# Patient Record
Sex: Female | Born: 1990 | Race: Black or African American | Hispanic: No | Marital: Single | Smoking: Current some day smoker
Health system: Southern US, Community
[De-identification: ages and names within clinical notes are randomized; demographics above are authoritative.]

## PROBLEM LIST (undated history)

## (undated) ENCOUNTER — Inpatient Hospital Stay (HOSPITAL_COMMUNITY): Payer: Self-pay

## (undated) DIAGNOSIS — I10 Essential (primary) hypertension: Secondary | ICD-10-CM

## (undated) DIAGNOSIS — H00019 Hordeolum externum unspecified eye, unspecified eyelid: Secondary | ICD-10-CM

## (undated) DIAGNOSIS — B999 Unspecified infectious disease: Secondary | ICD-10-CM

## (undated) DIAGNOSIS — E669 Obesity, unspecified: Secondary | ICD-10-CM

## (undated) HISTORY — PX: NO PAST SURGERIES: SHX2092

---
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
## 2011-01-11 ENCOUNTER — Emergency Department (HOSPITAL_COMMUNITY)
Admission: EM | Admit: 2011-01-11 | Discharge: 2011-01-11 | Disposition: A | Discharge status: Home / Self Care | Payer: Commercial Managed Care - PPO | Attending: Emergency Medicine | Admitting: Emergency Medicine

## 2011-01-11 DIAGNOSIS — N898 Other specified noninflammatory disorders of vagina: Secondary | ICD-10-CM | POA: Insufficient documentation

## 2011-01-11 DIAGNOSIS — N390 Urinary tract infection, site not specified: Secondary | ICD-10-CM | POA: Insufficient documentation

## 2011-01-11 DIAGNOSIS — E669 Obesity, unspecified: Secondary | ICD-10-CM | POA: Insufficient documentation

## 2011-01-11 DIAGNOSIS — R10819 Abdominal tenderness, unspecified site: Secondary | ICD-10-CM | POA: Insufficient documentation

## 2011-01-11 DIAGNOSIS — R109 Unspecified abdominal pain: Secondary | ICD-10-CM | POA: Insufficient documentation

## 2011-01-11 LAB — URINALYSIS, ROUTINE W REFLEX MICROSCOPIC
Glucose, UA: NEGATIVE mg/dL
Protein, ur: 100 mg/dL — AB
Specific Gravity, Urine: 1.021 (ref 1.005–1.030)
pH: 7 (ref 5.0–8.0)

## 2011-01-11 LAB — CBC
MCHC: 32.8 g/dL (ref 30.0–36.0)
Platelets: 291 10*3/uL (ref 150–400)
RDW: 15 % (ref 11.5–15.5)
WBC: 7 10*3/uL (ref 4.0–10.5)

## 2011-01-11 LAB — URINE MICROSCOPIC-ADD ON

## 2012-09-20 ENCOUNTER — Emergency Department (HOSPITAL_COMMUNITY)
Admission: EM | Admit: 2012-09-20 | Discharge: 2012-09-20 | Disposition: A | Discharge status: Home / Self Care | Payer: Self-pay | Attending: Emergency Medicine | Admitting: Emergency Medicine

## 2012-09-20 ENCOUNTER — Encounter (HOSPITAL_COMMUNITY): Payer: Self-pay | Admitting: Emergency Medicine

## 2012-09-20 DIAGNOSIS — R112 Nausea with vomiting, unspecified: Secondary | ICD-10-CM | POA: Insufficient documentation

## 2012-09-20 DIAGNOSIS — R197 Diarrhea, unspecified: Secondary | ICD-10-CM | POA: Insufficient documentation

## 2012-09-20 DIAGNOSIS — F17200 Nicotine dependence, unspecified, uncomplicated: Secondary | ICD-10-CM | POA: Insufficient documentation

## 2012-09-20 DIAGNOSIS — Z3202 Encounter for pregnancy test, result negative: Secondary | ICD-10-CM | POA: Insufficient documentation

## 2012-09-20 LAB — POCT PREGNANCY, URINE: Preg Test, Ur: NEGATIVE

## 2012-09-20 LAB — URINALYSIS, ROUTINE W REFLEX MICROSCOPIC
Nitrite: NEGATIVE
Protein, ur: NEGATIVE mg/dL
Specific Gravity, Urine: 1.023 (ref 1.005–1.030)
Urobilinogen, UA: 1 mg/dL (ref 0.0–1.0)

## 2012-09-20 LAB — POCT I-STAT, CHEM 8
BUN: 10 mg/dL (ref 6–23)
Calcium, Ion: 1.27 mmol/L — ABNORMAL HIGH (ref 1.12–1.23)
Creatinine, Ser: 0.9 mg/dL (ref 0.50–1.10)
Hemoglobin: 13.9 g/dL (ref 12.0–15.0)
Sodium: 141 mEq/L (ref 135–145)
TCO2: 25 mmol/L (ref 0–100)

## 2012-09-20 MED ORDER — METOCLOPRAMIDE HCL 10 MG PO TABS: 10.0000 mg | ORAL_TABLET | Freq: Four times a day (QID) | ORAL | Status: DC | PRN

## 2012-09-20 MED ORDER — ONDANSETRON 8 MG PO TBDP: ORAL_TABLET | ORAL | Status: DC

## 2012-09-20 MED ORDER — LOPERAMIDE HCL 2 MG PO CAPS: ORAL_CAPSULE | ORAL | Status: DC

## 2012-09-20 MED ORDER — ONDANSETRON 4 MG PO TBDP
8.0000 mg | ORAL_TABLET | Freq: Once | ORAL | Status: AC
  Administered 2012-09-20: 8 mg via ORAL
  Filled 2012-09-20: qty 2

## 2012-09-20 NOTE — ED Notes (Signed)
Pt c/o N/V/D starting today 

## 2012-09-20 NOTE — ED Provider Notes (Signed)
History   This chart was scribed for Hurman Horn, MD by Leone Payor, ED Scribe. This patient was seen in room TR09C/TR09C and the patient's care was started at 1645.   CSN: [id number]  Arrival date & time 09/20/12  1312   First MD Initiated Contact with Patient 09/20/12 1645      Chief Complaint  Patient presents with  . Emesis  . Diarrhea     The history is provided by the patient. No language interpreter was used.    Kayla Bautista is a 21 y.o. female who presents to the Emergency Department complaining of new, ongoing, several episodes of non-bloody vomiting starting today. She has associated nausea, diarrhea as well. Pt states she does not know if she has had ill contacts but could have caught something from work. Pt states she has eaten a few crackers and is able to keep water down in the ED. She denies fever, hallucinations, constipation, cough, chest pain, SOB, vaginal bleeding, vaginal discharge, rash, body aches. She does not have constant or localized abdominal pain.  She denies having DM, sickle cell, asthma history.   Pt is a current everyday smoker but denies alcohol use. History reviewed. No pertinent past medical history.  History reviewed. No pertinent past surgical history.  History reviewed. No pertinent family history.  History  Substance Use Topics  . Smoking status: Current Every Day Smoker  . Smokeless tobacco: Not on file  . Alcohol Use: No    No OB history provided.   Review of Systems  10 Systems reviewed and are negative for acute change except as noted in the HPI.   Allergies  Review of patient's allergies indicates no known allergies.  Home Medications   Current Outpatient Rx  Name  Route  Sig  Dispense  Refill  . LOPERAMIDE HCL 2 MG PO CAPS      Take two tabs po initially, then one tab after each loose stool: max 8 tabs in 24 hours   12 capsule   0   . METOCLOPRAMIDE HCL 10 MG PO TABS   Oral   Take 1 tablet (10 mg total) by  mouth every 6 (six) hours as needed (nausea/headache).   6 tablet   0   . ONDANSETRON 8 MG PO TBDP      8mg  ODT q4 hours prn nausea   4 tablet   0     BP 160/80  Pulse 76  Temp 98.1 F (36.7 C) (Oral)  Resp 18  SpO2 100%  Physical Exam  Nursing note and vitals reviewed. Constitutional:       Awake, alert, nontoxic appearance.  HENT:  Head: Atraumatic.       Mucous membranes are moist.   Eyes: Right eye exhibits no discharge. Left eye exhibits no discharge.  Neck: Neck supple.  Cardiovascular: Normal rate, regular rhythm and normal heart sounds.   No murmur heard. Pulmonary/Chest: Effort normal and breath sounds normal. She has no wheezes. She exhibits no tenderness.  Abdominal: Soft. Bowel sounds are normal. She exhibits no distension and no mass. There is no tenderness. There is no rebound and no guarding.       No CVAT  Musculoskeletal: She exhibits no tenderness.       Baseline ROM, no obvious new focal weakness.  Neurological:       Mental status and motor strength appears baseline for patient and situation.  Skin: No rash noted.  Psychiatric: She has a normal mood and affect.  ED Course  Procedures (including critical care time)  DIAGNOSTIC STUDIES: Oxygen Saturation is 100% on room air, normal by my interpretation.    COORDINATION OF CARE:  Patient / Family / Caregiver informed of clinical course, understand medical decision-making process, and agree with plan.    Labs Reviewed  URINALYSIS, ROUTINE W REFLEX MICROSCOPIC - Abnormal; Notable for the following:    APPearance HAZY (*)     All other components within normal limits  POCT I-STAT, CHEM 8 - Abnormal; Notable for the following:    Calcium, Ion 1.27 (*)     All other components within normal limits  POCT PREGNANCY, URINE  LAB REPORT - SCANNED   No results found.   1. Vomiting and diarrhea       MDM   I personally performed the services described in this documentation, which was  scribed in my presence. The recorded information has been reviewed and is accurate.  I doubt any other EMC precluding discharge at this time including, but not necessarily limited to the following:SBI.  Hurman Horn, MD 09/23/12 [phone]

## 2012-09-20 NOTE — ED Notes (Signed)
Pt states she thought she saw something when she had a bm this afternoon, "i think it might have been a tapeworm"

## 2012-11-15 ENCOUNTER — Encounter (HOSPITAL_COMMUNITY): Payer: Self-pay

## 2012-11-15 ENCOUNTER — Emergency Department (INDEPENDENT_AMBULATORY_CARE_PROVIDER_SITE_OTHER)
Admission: EM | Admit: 2012-11-15 | Discharge: 2012-11-15 | Disposition: A | Discharge status: Home / Self Care | Payer: Self-pay | Source: Home / Self Care | Attending: Family Medicine | Admitting: Family Medicine

## 2012-11-15 DIAGNOSIS — B354 Tinea corporis: Secondary | ICD-10-CM

## 2012-11-15 MED ORDER — KETOCONAZOLE 2 % EX CREA: TOPICAL_CREAM | CUTANEOUS | Status: DC

## 2012-11-15 NOTE — ED Notes (Signed)
Patient complains of having a rash on her torso for almost 2 weeks

## 2012-11-15 NOTE — ED Provider Notes (Signed)
History   CSN: [id number]  Arrival date & time 11/15/12  1150   First MD Initiated Contact with Patient 11/15/12 1245     Chief Complaint  Patient presents with  . Rash    HPI Pt reports that she has been having a skin irriation for the past week.  It is located on back, stomach area.  It has been blistered.   Pt says that it does not itch.  Rash has not been painful.  Pt says that it is getting darker.  Pt says that it has been spreading.  Pt says that she is up to date with her vaccinations.  She says that she has no other medical history and not taking any medications.   Pt says that she has not been near anyone that has had a similar rash.  Her boyfriend does not have the rash.    History reviewed. No pertinent past medical history.  History reviewed. No pertinent past surgical history.  family history Hypertension  History  Substance Use Topics  . Smoking status: Current Every Day Smoker  . Smokeless tobacco: Not on file  . Alcohol Use: No    OB History   Grav Para Term Preterm Abortions TAB SAB Ect Mult Living                 Review of Systems  Skin: Positive for rash.  All other systems reviewed and are negative.    Allergies  Review of patient's allergies indicates no known allergies.  Home Medications   Current Outpatient Rx  Name  Route  Sig  Dispense  Refill  . loperamide (IMODIUM) 2 MG capsule      Take two tabs po initially, then one tab after each loose stool: max 8 tabs in 24 hours   12 capsule   0   . metoCLOPramide (REGLAN) 10 MG tablet   Oral   Take 1 tablet (10 mg total) by mouth every 6 (six) hours as needed (nausea/headache).   6 tablet   0   . ondansetron (ZOFRAN ODT) 8 MG disintegrating tablet      8mg  ODT q4 hours prn nausea   4 tablet   0    Pulse 83  Temp(Src) 98.7 F (37.1 C) (Oral)  SpO2 100%  LMP 11/07/2012  Physical Exam  Nursing note and vitals reviewed. Constitutional: She is oriented to person, place, and time.  She appears well-developed and well-nourished. No distress.  HENT:  Head: Normocephalic and atraumatic.  Eyes: Conjunctivae and EOM are normal. Pupils are equal, round, and reactive to light.  Neck: Normal range of motion. Neck supple.  Cardiovascular: Normal rate and regular rhythm.   Pulmonary/Chest: Effort normal.  Abdominal: Soft.  Musculoskeletal: Normal range of motion.  Neurological: She is alert and oriented to person, place, and time.  Skin: Skin is warm, dry and intact. Rash noted.     Psychiatric: She has a normal mood and affect. Her behavior is normal. Judgment and thought content normal.    ED Course  Procedures (including critical care time)  Labs Reviewed - No data to display No results found.  No diagnosis found.  MDM  IMPRESSION  Tinea corporis  RECOMMENDATIONS / PLAN Ketoconazole 2% creme apply to rash lesions twice per day  Printed patient education materials for patient to review  FOLLOW UP 3 weeks  The patient was given clear instructions to go to ER or return to medical center if symptoms don't improve, worsen or new  problems develop.  The patient verbalized understanding.  The patient was told to call to get lab results if they haven't heard anything in the next week.            Cleora Fleet, MD 11/15/12 1304

## 2012-12-19 ENCOUNTER — Emergency Department (HOSPITAL_COMMUNITY)
Admission: EM | Admit: 2012-12-19 | Discharge: 2012-12-19 | Disposition: A | Discharge status: Home / Self Care | Payer: Self-pay | Attending: Emergency Medicine | Admitting: Emergency Medicine

## 2012-12-19 ENCOUNTER — Encounter (HOSPITAL_COMMUNITY): Payer: Self-pay | Admitting: *Deleted

## 2012-12-19 DIAGNOSIS — R110 Nausea: Secondary | ICD-10-CM | POA: Insufficient documentation

## 2012-12-19 DIAGNOSIS — IMO0001 Reserved for inherently not codable concepts without codable children: Secondary | ICD-10-CM

## 2012-12-19 DIAGNOSIS — R51 Headache: Secondary | ICD-10-CM

## 2012-12-19 DIAGNOSIS — H53149 Visual discomfort, unspecified: Secondary | ICD-10-CM | POA: Insufficient documentation

## 2012-12-19 DIAGNOSIS — S0501XA Injury of conjunctiva and corneal abrasion without foreign body, right eye, initial encounter: Secondary | ICD-10-CM

## 2012-12-19 DIAGNOSIS — S0500XA Injury of conjunctiva and corneal abrasion without foreign body, unspecified eye, initial encounter: Secondary | ICD-10-CM | POA: Insufficient documentation

## 2012-12-19 DIAGNOSIS — F17200 Nicotine dependence, unspecified, uncomplicated: Secondary | ICD-10-CM | POA: Insufficient documentation

## 2012-12-19 DIAGNOSIS — X58XXXA Exposure to other specified factors, initial encounter: Secondary | ICD-10-CM | POA: Insufficient documentation

## 2012-12-19 DIAGNOSIS — H5710 Ocular pain, unspecified eye: Secondary | ICD-10-CM | POA: Insufficient documentation

## 2012-12-19 DIAGNOSIS — Y929 Unspecified place or not applicable: Secondary | ICD-10-CM | POA: Insufficient documentation

## 2012-12-19 DIAGNOSIS — Y939 Activity, unspecified: Secondary | ICD-10-CM | POA: Insufficient documentation

## 2012-12-19 DIAGNOSIS — Z8669 Personal history of other diseases of the nervous system and sense organs: Secondary | ICD-10-CM | POA: Insufficient documentation

## 2012-12-19 DIAGNOSIS — R030 Elevated blood-pressure reading, without diagnosis of hypertension: Secondary | ICD-10-CM | POA: Insufficient documentation

## 2012-12-19 HISTORY — DX: Hordeolum externum unspecified eye, unspecified eyelid: H00.019

## 2012-12-19 MED ORDER — HYDROCODONE-ACETAMINOPHEN 5-325 MG PO TABS: ORAL_TABLET | ORAL | Status: DC

## 2012-12-19 MED ORDER — HYDROCODONE-ACETAMINOPHEN 5-325 MG PO TABS
1.0000 | ORAL_TABLET | Freq: Once | ORAL | Status: AC
  Administered 2012-12-19: 1 via ORAL
  Filled 2012-12-19: qty 1

## 2012-12-19 MED ORDER — FLUORESCEIN SODIUM 1 MG OP STRP
ORAL_STRIP | OPHTHALMIC | Status: AC
  Administered 2012-12-19: 20:00:00
  Filled 2012-12-19: qty 1

## 2012-12-19 MED ORDER — TOBRAMYCIN 0.3 % OP SOLN
2.0000 [drp] | OPHTHALMIC | Status: DC
  Administered 2012-12-19: 2 [drp] via OPHTHALMIC
  Filled 2012-12-19: qty 5

## 2012-12-19 MED ORDER — TETRACAINE HCL 0.5 % OP SOLN
2.0000 [drp] | Freq: Once | OPHTHALMIC | Status: AC
  Administered 2012-12-19: 2 [drp] via OPHTHALMIC
  Filled 2012-12-19: qty 2

## 2012-12-19 NOTE — ED Provider Notes (Signed)
History     CSN: [id number]  Arrival date & time 12/19/12  1502   First MD Initiated Contact with Patient 12/19/12 1720      Chief Complaint  Patient presents with  . Headache    (Consider location/radiation/quality/duration/timing/severity/associated sxs/prior treatment) HPI Comments: Patient reports headache involving her right eye associated with eye pain and excessive tearing from the right eye over the last several days. She is nearsighted and uses glasses but not contacts. She denies any significant change in vision except for due to the tearing she does have some blurriness. She had some nausea today but no vomiting. She denies fever or chills. She thinks she may have had an eyelash in her right eye and she rubbed it out. Otherwise she denies any trauma. She also reports that she's had a long history of recurring styes but denies feeling a bump, mass or sensation of foreign body in her right eye. She reports that her right eyelid seems somewhat swollen to her. She denies a significant history of allergies. She denies a stiff neck, focal numbness or weakness of her arms or legs, slurred speech or facial droop.  Patient is a 22 y.o. female presenting with headaches. The history is provided by the patient and a friend.  Headache Associated symptoms: eye pain, nausea and photophobia   Associated symptoms: no fever, no neck pain, no neck stiffness, no numbness and no vomiting     Past Medical History  Diagnosis Date  . Stye     History reviewed. No pertinent past surgical history.  History reviewed. No pertinent family history.  History  Substance Use Topics  . Smoking status: Current Every Day Smoker  . Smokeless tobacco: Not on file  . Alcohol Use: No    OB History   Grav Para Term Preterm Abortions TAB SAB Ect Mult Living                  Review of Systems  Constitutional: Negative for fever and chills.  HENT: Negative for neck pain and neck stiffness.   Eyes:  Positive for photophobia, pain and redness.  Gastrointestinal: Positive for nausea. Negative for vomiting.  Neurological: Positive for headaches. Negative for weakness and numbness.  Psychiatric/Behavioral: Negative for confusion.  All other systems reviewed and are negative.    Allergies  Review of patient's allergies indicates no known allergies.  Home Medications   Current Outpatient Rx  Name  Route  Sig  Dispense  Refill  . ibuprofen (ADVIL,MOTRIN) 800 MG tablet   Oral   Take 800 mg by mouth every 8 (eight) hours as needed for pain.         Marland Kitchen HYDROcodone-acetaminophen (NORCO/VICODIN) 5-325 MG per tablet      1-2 tablets po q 6 hours prn moderate to severe pain   20 tablet   0     BP 172/80  Pulse 72  Temp(Src) 98.7 F (37.1 C) (Oral)  Resp 18  SpO2 100%  LMP 11/21/2012  Physical Exam  Nursing note and vitals reviewed. Constitutional: She is oriented to person, place, and time. She appears well-developed and well-nourished.  HENT:  Head: Normocephalic and atraumatic.  Eyes: EOM are normal. Pupils are equal, round, and reactive to light. No foreign bodies found. Right eye exhibits chemosis. Right eye exhibits no hordeolum. No foreign body present in the right eye. Right conjunctiva is injected. Right conjunctiva has no hemorrhage.  Fundoscopic exam:      The right eye shows no papilledema.  Slit lamp exam:      The right eye shows fluorescein uptake. The right eye shows no corneal abrasion and no corneal ulcer.    Neck: Normal range of motion. Neck supple.  Cardiovascular: Normal rate.   Pulmonary/Chest: Effort normal.  Abdominal: Soft.  Neurological: She is alert and oriented to person, place, and time. No cranial nerve deficit or sensory deficit. She exhibits normal muscle tone. Coordination normal. GCS eye subscore is 4. GCS verbal subscore is 5. GCS motor subscore is 6.  Normal coordination, gait  Skin: Skin is warm and dry. No rash noted.    ED Course    Procedures (including critical care time)  Labs Reviewed - No data to display No results found.   1. Conjunctival abrasion, right, initial encounter   2. Headache   3. Elevated blood pressure       MDM   Patient's eye pain and headache improved after tetracaine administration to right eye. Eye pressure measured with an almond her on right I measured 17 mm of mercury pressure. Visual acuity was unremarkable. Slight fluorescein uptake on the right lateral conjunctival margin. Plan is to provide antibiotic eyedrops, prescription for oral analgesics and referral to ophthalmologist for reexamination in a more urgent fashion either tomorrow or the next day. No focal neuro deficits otherwise. No rash or stiff neck or fevers noted. Patient does have a history of hypertension, however I feel that this is likely due to the pain.        Gavin Pound. Ryn Peine, MD 12/19/12 2019

## 2012-12-19 NOTE — Discharge Instructions (Signed)
 Eye Injury The eye can be injured by scratches, foreign bodies, contact lenses, very bright light (welding torches), and chemical irritation. The cornea (the clear part of the eye) is very sensitive; even minor injuries to it are painful. Most injuries to the cornea heal in 2-4 days. Treatment may include:  Antibiotic eye drops or ointment may be needed to soothe the eye and prevent infection. Drops that numb the eye (anesthetic drops) should not be used repeatedly as they can delay healing. Drops to dilate the pupil for 1-2 days are sometimes used to relieve pain.  Patching the eye can reduce irritation from blinking and bright light. When your eye is patched, you should not drive or operate machinery because your side vision and your ability to judge distances are decreased.  Rest your eye. Stay in a darkened room and wear sunglasses to reduce the irritation from light.  Do not rub your eye for the next 2 weeks to allow complete healing. If you have contact lenses, do not wear them until your caregiver says it is safe to do so. Pain medicine may also be needed for 1-2 days.  SEEK MEDICAL CARE IF:   You have increased pain, persistent irritation or blurred vision over the next 2 days.  Your symptoms are getting worse or not improving.  You have any other questions or concerns regarding your injury. Document Released: 10/23/2004 Document Revised: 12/08/2011 Document Reviewed: 09/15/2005 [facility] Patient Information 2013 [place], [place].    Narcotic and benzodiazepine use may cause drowsiness, slowed breathing or dependence.  Please use with caution and do not drive, operate machinery or watch young children alone while taking them.  Taking combinations of these medications or drinking alcohol will potentiate these effects.

## 2012-12-19 NOTE — ED Notes (Signed)
The pt has had a headache for 7 days no nv.  She has headaches but none as  Severe as this.  lmp last month

## 2012-12-19 NOTE — ED Notes (Signed)
Dr. Ghim at bedside. 

## 2012-12-19 NOTE — ED Notes (Signed)
Pt ambulated to restroom. 

## 2013-05-04 ENCOUNTER — Inpatient Hospital Stay (HOSPITAL_COMMUNITY)
Admission: AD | Admit: 2013-05-04 | Discharge: 2013-05-04 | Disposition: A | Discharge status: Home / Self Care | Payer: Medicaid Other | Source: Ambulatory Visit | Attending: Obstetrics & Gynecology | Admitting: Obstetrics & Gynecology

## 2013-05-04 ENCOUNTER — Encounter (HOSPITAL_COMMUNITY): Payer: Self-pay | Admitting: *Deleted

## 2013-05-04 DIAGNOSIS — Z3201 Encounter for pregnancy test, result positive: Secondary | ICD-10-CM | POA: Insufficient documentation

## 2013-05-04 HISTORY — DX: Unspecified infectious disease: B99.9

## 2013-05-04 LAB — POCT PREGNANCY, URINE: Preg Test, Ur: POSITIVE — AB

## 2013-05-04 NOTE — MAU Note (Signed)
Pt unable to pee, water given, returned to lobby

## 2013-05-04 NOTE — MAU Note (Signed)
Has been having little cramps  For past 2 wks, feels like period is going to start.  +HPT today, here for confirmation.

## 2013-05-04 NOTE — MAU Provider Note (Signed)
[name] is a 22 y.o. G1P0 at [redacted]w[redacted]d who presents to MAU today for confirmation of pregnancy. The patient denies abdominal pain, bleeding or any issues today.   BP 139/80  Pulse 87  Temp(Src) 98.3 F (36.8 C) (Oral)  Resp 20  Ht 5\' 6"  (1.676 m)  Wt 315 lb (142.883 kg)  BMI 50.87 kg/m2  LMP 03/26/2013 GENERAL: Well-developed, well-nourished female in no acute distress.  HEENT: Normocephalic, atraumatic.   LUNGS: Effort normal HEART: Regular rate  SKIN: Warm, dry and without erythema PSYCH: Normal mood and affect  Results for orders placed during the hospital encounter of 05/04/13 (from the past 24 hour(s))  POCT PREGNANCY, URINE     Status: Abnormal   Collection Time    05/04/13  2:18 PM      Result Value Range   Preg Test, Ur POSITIVE (*) NEGATIVE    A: Positive pregnancy test  P: Discharge home First trimester warning signs reviewed Patient given pregnancy confirmation letter and list of OB providers in the area Patient may return to MAU as needed  Freddi Starr, PA-C 05/04/2013 2:29 PM

## 2013-05-25 ENCOUNTER — Ambulatory Visit (HOSPITAL_COMMUNITY)
Admission: RE | Admit: 2013-05-25 | Discharge: 2013-05-25 | Disposition: A | Discharge status: Home / Self Care | Payer: Medicaid Other | Source: Ambulatory Visit | Attending: Advanced Practice Midwife | Admitting: Advanced Practice Midwife

## 2013-05-25 ENCOUNTER — Encounter: Payer: Self-pay | Admitting: Advanced Practice Midwife

## 2013-05-25 ENCOUNTER — Ambulatory Visit (INDEPENDENT_AMBULATORY_CARE_PROVIDER_SITE_OTHER): Payer: Medicaid Other | Admitting: Advanced Practice Midwife

## 2013-05-25 VITALS — BP 153/91 | Temp 97.5°F | Wt 309.6 lb

## 2013-05-25 DIAGNOSIS — O3680X0 Pregnancy with inconclusive fetal viability, not applicable or unspecified: Secondary | ICD-10-CM | POA: Insufficient documentation

## 2013-05-25 DIAGNOSIS — O99891 Other specified diseases and conditions complicating pregnancy: Secondary | ICD-10-CM

## 2013-05-25 DIAGNOSIS — Z3400 Encounter for supervision of normal first pregnancy, unspecified trimester: Secondary | ICD-10-CM | POA: Insufficient documentation

## 2013-05-25 DIAGNOSIS — Z3689 Encounter for other specified antenatal screening: Secondary | ICD-10-CM | POA: Insufficient documentation

## 2013-05-25 LAB — POCT URINALYSIS DIP (DEVICE)
Glucose, UA: NEGATIVE mg/dL
Hgb urine dipstick: NEGATIVE
Nitrite: NEGATIVE
Protein, ur: NEGATIVE mg/dL
Specific Gravity, Urine: >=1.03 (ref 1.005–1.030)
Urobilinogen, UA: 0.2 mg/dL (ref 0.0–1.0)

## 2013-05-25 NOTE — Progress Notes (Signed)
   Subjective:    Kayla Bautista is a G1P0 [redacted]w[redacted]d being seen today for her first obstetrical visit.  Her obstetrical history is significant for obesity. Patient does intend to breast feed. Pregnancy history fully reviewed.  Patient reports nausea.  Filed Vitals:   05/25/13 1001 05/25/13 1006  BP: 152/93 153/91  Temp: 97.5 F (36.4 C)   Weight: 309 lb 9.6 oz (140.434 kg)     HISTORY: OB History  Gravida Para Term Preterm AB SAB TAB Ectopic Multiple Living  1             # Outcome Date GA Lbr Len/2nd Weight Sex Delivery Anes PTL Lv  1 CUR              Past Medical History  Diagnosis Date  . Stye   . Infection     UTI   Past Surgical History  Procedure Laterality Date  . No past surgeries     Family History  Problem Relation Age of Onset  . Diabetes Mother   . Diabetes Maternal Grandmother   . Diabetes Maternal Grandfather      Exam    Uterus:   Unable to palpate due to body habitus  Pelvic Exam:    Perineum: No Hemorrhoids, Normal Perineum   Vulva: normal   Vagina:  normal mucosa, normal discharge   pH:    Cervix: no cervical motion tenderness, no lesions and nulliparous appearance   Adnexa: normal adnexa and no mass, fullness, tenderness   Bony Pelvis: average  System: Breast:  normal appearance, no masses or tenderness   Skin: normal coloration and turgor, no rashes    Neurologic: normal mood, gait normal; reflexes normal and symmetric   Extremities: normal strength, tone, and muscle mass, ROM of all joints is normal   HEENT neck supple with midline trachea and thyroid without masses   Mouth/Teeth mucous membranes moist, pharynx normal without lesions   Neck no masses   Cardiovascular: regular rate and rhythm, no murmurs or gallops   Respiratory:  appears well, vitals normal, no respiratory distress, acyanotic, normal RR, ear and throat exam is normal, neck free of mass or lymphadenopathy, chest clear, no wheezing, crepitations, rhonchi, normal symmetric  air entry   Abdomen: soft, non-tender; bowel sounds normal; no masses,  no organomegaly   Urinary: urethral meatus normal      Assessment:    Pregnancy: G1P0 1. Other current maternal conditions classifiable elsewhere, antepartum   2. Supervision of normal first pregnancy, unspecified trimester         Plan:     Initial labs drawn. Early glucola done today Prenatal vitamins. Problem list reviewed and updated. Genetic Screening discussed First Screen: requested.  Ultrasound discussed; fetal survey: requested.  Follow up in 4 weeks. 50% of 30 min visit spent on counseling and coordination of care.     LEFTWICH-KIRBY, Deklin Bieler 05/25/2013

## 2013-05-25 NOTE — Progress Notes (Signed)
Pulse- 78  Pain/pressure-back/side Weight gain 11-20lb New ob packet given Early glucola given due @ 1058 for BMI and mother has diabetes

## 2013-05-26 LAB — OBSTETRIC PANEL
Basophils Absolute: 0 10*3/uL (ref 0.0–0.1)
Basophils Relative: 0 % (ref 0–1)
Eosinophils Absolute: 0.2 10*3/uL (ref 0.0–0.7)
Hemoglobin: 11.8 g/dL — ABNORMAL LOW (ref 12.0–15.0)
Hepatitis B Surface Ag: NEGATIVE
MCH: 27.3 pg (ref 26.0–34.0)
MCHC: 33.4 g/dL (ref 30.0–36.0)
Neutro Abs: 4.4 10*3/uL (ref 1.7–7.7)
Neutrophils Relative %: 61 % (ref 43–77)
Platelets: 298 10*3/uL (ref 150–400)
RDW: 14.4 % (ref 11.5–15.5)
Rh Type: NEGATIVE

## 2013-05-26 LAB — HIV ANTIBODY (ROUTINE TESTING W REFLEX): HIV: NONREACTIVE

## 2013-05-27 ENCOUNTER — Other Ambulatory Visit: Payer: Self-pay | Admitting: Advanced Practice Midwife

## 2013-05-27 ENCOUNTER — Telehealth: Payer: Self-pay

## 2013-05-27 LAB — HEMOGLOBINOPATHY EVALUATION
Hemoglobin Other: 0 %
Hgb A: 97.5 % (ref 96.8–97.8)
Hgb F Quant: 0 % (ref 0.0–2.0)
Hgb S Quant: 0 %

## 2013-05-27 MED ORDER — NITROFURANTOIN MONOHYD MACRO 100 MG PO CAPS: 100.0000 mg | ORAL_CAPSULE | Freq: Two times a day (BID) | ORAL | Status: DC

## 2013-05-27 NOTE — Telephone Encounter (Signed)
Called pt and left message to return call to the clinics. Re:  Kayla Bautista wanted [place] to call pt to inform her of the need for 3hr lab result and pt has UTI and antibiotic therapy has been sent to her pharmacy.  3 hr appt scheduled for Thursday June 02, 2013 @ 0800

## 2013-05-27 NOTE — Progress Notes (Signed)
Urine culture positive for Ecoli at initial prenatal visit.  Macrobid BID x7 days sent to pt pharmacy. Pt called by clinic to let her know about abx and her need for 3 hour glucose test (1 hour result 136).

## 2013-05-28 LAB — CULTURE, OB URINE: Colony Count: >=100000

## 2013-05-31 ENCOUNTER — Other Ambulatory Visit: Payer: Self-pay

## 2013-05-31 ENCOUNTER — Telehealth: Payer: Self-pay | Admitting: *Deleted

## 2013-05-31 ENCOUNTER — Encounter: Payer: Self-pay | Admitting: Family Medicine

## 2013-05-31 DIAGNOSIS — O99810 Abnormal glucose complicating pregnancy: Secondary | ICD-10-CM

## 2013-05-31 NOTE — Telephone Encounter (Signed)
Kayla Bautista left a message stating she needs a call back that it is urgent. States she talked to the appointment desk and is scheduled for a 3 hr test Thursday she knew nothing about.  Called Kayla Bautista and she states she spoke again to front desk and they told her to come in today to do the 3 hr gtt which she did.  Asked what happens when it came back- explanations given that we would call her if positive for GDM and have come on Monday for education for diet and doing cbg's.  Patient voices understanding.

## 2013-06-01 ENCOUNTER — Encounter: Payer: Self-pay | Admitting: Obstetrics & Gynecology

## 2013-06-01 LAB — GLUCOSE TOLERANCE, 3 HOURS
Glucose Tolerance, 1 hour: 141 mg/dL (ref 70–189)
Glucose Tolerance, 2 hour: 151 mg/dL (ref 70–164)
Glucose Tolerance, Fasting: 85 mg/dL (ref 70–104)

## 2013-06-01 NOTE — Telephone Encounter (Signed)
Called patient, and informed her of UTI and need to pickup medication at her [facility] pharmacy and informed patient of normal 3 hr gtt. Patient verbalized understanding to all and had no further questions

## 2013-06-02 ENCOUNTER — Other Ambulatory Visit: Payer: Self-pay

## 2013-06-02 ENCOUNTER — Telehealth: Payer: Self-pay | Admitting: *Deleted

## 2013-06-02 NOTE — Telephone Encounter (Signed)
Pt left message stating that the medication prescribed is too expensive (>$40) she would like an alternate Rx. Please call back.

## 2013-06-04 ENCOUNTER — Telehealth: Payer: Self-pay | Admitting: Advanced Practice Midwife

## 2013-06-04 ENCOUNTER — Other Ambulatory Visit: Payer: Self-pay | Admitting: Advanced Practice Midwife

## 2013-06-04 MED ORDER — CEPHALEXIN 500 MG PO CAPS: 500.0000 mg | ORAL_CAPSULE | Freq: Four times a day (QID) | ORAL | Status: DC

## 2013-06-04 NOTE — Telephone Encounter (Signed)
Pt unable to afford Macrobid.  Keflex 500 mg  QID x7 days.

## 2013-06-06 NOTE — Telephone Encounter (Signed)
Called patient and informed her of new medication. Patient verbalized understanding and had no further questions

## 2013-06-16 ENCOUNTER — Other Ambulatory Visit: Payer: Self-pay | Admitting: Advanced Practice Midwife

## 2013-06-16 DIAGNOSIS — Z3682 Encounter for antenatal screening for nuchal translucency: Secondary | ICD-10-CM

## 2013-06-22 ENCOUNTER — Ambulatory Visit (INDEPENDENT_AMBULATORY_CARE_PROVIDER_SITE_OTHER): Payer: Medicaid Other | Admitting: Advanced Practice Midwife

## 2013-06-22 VITALS — BP 147/77 | Wt 314.5 lb

## 2013-06-22 DIAGNOSIS — Z3400 Encounter for supervision of normal first pregnancy, unspecified trimester: Secondary | ICD-10-CM

## 2013-06-22 LAB — POCT URINALYSIS DIP (DEVICE)
Bilirubin Urine: NEGATIVE
Glucose, UA: NEGATIVE mg/dL
Hgb urine dipstick: NEGATIVE
Leukocytes, UA: NEGATIVE
Nitrite: NEGATIVE
Urobilinogen, UA: 0.2 mg/dL (ref 0.0–1.0)
pH: 7 (ref 5.0–8.0)

## 2013-06-22 LAB — US OB LIMITED

## 2013-06-22 NOTE — Progress Notes (Signed)
Doing well.  Denies vaginal bleeding, LOF, cramping/contractions. Unable to doppler FHT, U/S in office today. Retake of BP improved.  Discussed lifestyle changes for improved BP including walking, decreased sodium, increased water intake. Will continue to monitor.

## 2013-06-22 NOTE — Progress Notes (Signed)
P-[id number] 

## 2013-06-22 NOTE — Progress Notes (Signed)
Informal [place] for FHR = 164 per PW doppler.  Lisa Leftwich-Kirby notified.

## 2013-06-24 ENCOUNTER — Other Ambulatory Visit: Payer: Self-pay

## 2013-06-24 ENCOUNTER — Ambulatory Visit (HOSPITAL_COMMUNITY)
Admission: RE | Admit: 2013-06-24 | Discharge: 2013-06-24 | Disposition: A | Discharge status: Home / Self Care | Payer: Medicaid Other | Source: Ambulatory Visit | Attending: Family Medicine | Admitting: Family Medicine

## 2013-06-24 DIAGNOSIS — E669 Obesity, unspecified: Secondary | ICD-10-CM | POA: Insufficient documentation

## 2013-06-24 DIAGNOSIS — Z3682 Encounter for antenatal screening for nuchal translucency: Secondary | ICD-10-CM

## 2013-06-24 DIAGNOSIS — O351XX0 Maternal care for (suspected) chromosomal abnormality in fetus, not applicable or unspecified: Secondary | ICD-10-CM | POA: Insufficient documentation

## 2013-06-24 DIAGNOSIS — O3510X0 Maternal care for (suspected) chromosomal abnormality in fetus, unspecified, not applicable or unspecified: Secondary | ICD-10-CM | POA: Insufficient documentation

## 2013-06-24 DIAGNOSIS — Z3689 Encounter for other specified antenatal screening: Secondary | ICD-10-CM | POA: Insufficient documentation

## 2013-07-05 ENCOUNTER — Encounter: Payer: Self-pay | Admitting: *Deleted

## 2013-07-06 ENCOUNTER — Ambulatory Visit (INDEPENDENT_AMBULATORY_CARE_PROVIDER_SITE_OTHER): Payer: Medicaid Other | Admitting: Obstetrics and Gynecology

## 2013-07-06 ENCOUNTER — Encounter: Payer: Self-pay | Admitting: *Deleted

## 2013-07-06 VITALS — BP 137/92 | Temp 99.2°F | Wt 315.5 lb

## 2013-07-06 DIAGNOSIS — O99212 Obesity complicating pregnancy, second trimester: Secondary | ICD-10-CM | POA: Insufficient documentation

## 2013-07-06 DIAGNOSIS — O162 Unspecified maternal hypertension, second trimester: Secondary | ICD-10-CM

## 2013-07-06 DIAGNOSIS — Z3402 Encounter for supervision of normal first pregnancy, second trimester: Secondary | ICD-10-CM

## 2013-07-06 DIAGNOSIS — Z23 Encounter for immunization: Secondary | ICD-10-CM

## 2013-07-06 DIAGNOSIS — E669 Obesity, unspecified: Secondary | ICD-10-CM

## 2013-07-06 DIAGNOSIS — O169 Unspecified maternal hypertension, unspecified trimester: Secondary | ICD-10-CM

## 2013-07-06 LAB — COMPREHENSIVE METABOLIC PANEL
ALT: 15 U/L (ref 0–35)
AST: 13 U/L (ref 0–37)
Alkaline Phosphatase: 42 U/L (ref 39–117)
BUN: 7 mg/dL (ref 6–23)
Creat: 0.59 mg/dL (ref 0.50–1.10)
Potassium: 4.1 mEq/L (ref 3.5–5.3)

## 2013-07-06 LAB — POCT URINALYSIS DIP (DEVICE)
Glucose, UA: NEGATIVE mg/dL
Hgb urine dipstick: NEGATIVE
Ketones, ur: NEGATIVE mg/dL
Specific Gravity, Urine: 1.025 (ref 1.005–1.030)

## 2013-07-06 NOTE — Progress Notes (Signed)
Morbid obesity and probable CHTN (elevations noted at OP visits prepregnancy)> baseline labs. Refer to [facility]. Scheduled anatomic [place] at 18 wks.Had NT. Offer MSAFP next visit.  Fundus 1/3 to u.

## 2013-07-06 NOTE — Patient Instructions (Addendum)
Pregnancy - Second Trimester The second trimester of pregnancy (3 to 6 months) is a period of rapid growth for you and your baby. At the end of the sixth month, your baby is about 9 inches long and weighs 1 1/2 pounds. You will begin to feel the baby move between 18 and 20 weeks of the pregnancy. This is called quickening. Weight gain is faster. A clear fluid (colostrum) may leak out of your breasts. You may feel small contractions of the womb (uterus). This is known as false labor or Braxton-Hicks contractions. This is like a practice for labor when the baby is ready to be born. Usually, the problems with morning sickness have usually passed by the end of your first trimester. Some women develop small dark blotches (called cholasma, mask of pregnancy) on their face that usually goes away after the baby is born. Exposure to the sun makes the blotches worse. Acne may also develop in some pregnant women and pregnant women who have acne, may find that it goes away. PRENATAL EXAMS  Blood work may continue to be done during prenatal exams. These tests are done to check on your health and the probable health of your baby. Blood work is used to follow your blood levels (hemoglobin). Anemia (low hemoglobin) is common during pregnancy. Iron and vitamins are given to help prevent this. You will also be checked for diabetes between 24 and 28 weeks of the pregnancy. Some of the previous blood tests may be repeated.  The size of the uterus is measured during each visit. This is to make sure that the baby is continuing to grow properly according to the dates of the pregnancy.  Your blood pressure is checked every prenatal visit. This is to make sure you are not getting toxemia.  Your urine is checked to make sure you do not have an infection, diabetes or protein in the urine.  Your weight is checked often to make sure gains are happening at the suggested rate. This is to ensure that both you and your baby are growing  normally.  Sometimes, an ultrasound is performed to confirm the proper growth and development of the baby. This is a test which bounces harmless sound waves off the baby so your caregiver can more accurately determine due dates. Sometimes, a test is done on the amniotic fluid surrounding the baby. This test is called an amniocentesis. The amniotic fluid is obtained by sticking a needle into the belly (abdomen). This is done to check the chromosomes in instances where there is a concern about possible genetic problems with the baby. It is also sometimes done near the end of pregnancy if an early delivery is required. In this case, it is done to help make sure the baby's lungs are mature enough for the baby to live outside of the womb. CHANGES OCCURING IN THE SECOND TRIMESTER OF PREGNANCY Your body goes through many changes during pregnancy. They vary from person to person. Talk to your caregiver about changes you notice that you are concerned about.  During the second trimester, you will likely have an increase in your appetite. It is normal to have cravings for certain foods. This varies from person to person and pregnancy to pregnancy.  Your lower abdomen will begin to bulge.  You may have to urinate more often because the uterus and baby are pressing on your bladder. It is also common to get more bladder infections during pregnancy. You can help this by drinking lots of fluids  and emptying your bladder before and after intercourse.  You may begin to get stretch marks on your hips, abdomen, and breasts. These are normal changes in the body during pregnancy. There are no exercises or medicines to take that prevent this change.  You may begin to develop swollen and bulging veins (varicose veins) in your legs. Wearing support hose, elevating your feet for 15 minutes, 3 to 4 times a day and limiting salt in your diet helps lessen the problem.  Heartburn may develop as the uterus grows and pushes up  against the stomach. Antacids recommended by your caregiver helps with this problem. Also, eating smaller meals 4 to 5 times a day helps.  Constipation can be treated with a stool softener or adding bulk to your diet. Drinking lots of fluids, and eating vegetables, fruits, and whole grains are helpful.  Exercising is also helpful. If you have been very active up until your pregnancy, most of these activities can be continued during your pregnancy. If you have been less active, it is helpful to start an exercise program such as walking.  Hemorrhoids may develop at the end of the second trimester. Warm sitz baths and hemorrhoid cream recommended by your caregiver helps hemorrhoid problems.  Backaches may develop during this time of your pregnancy. Avoid heavy lifting, wear low heal shoes, and practice good posture to help with backache problems.  Some pregnant women develop tingling and numbness of their hand and fingers because of swelling and tightening of ligaments in the wrist (carpel tunnel syndrome). This goes away after the baby is born.  As your breasts enlarge, you may have to get a bigger bra. Get a comfortable, cotton, support bra. Do not get a nursing bra until the last month of the pregnancy if you will be nursing the baby.  You may get a dark line from your belly button to the pubic area called the linea nigra.  You may develop rosy cheeks because of increase blood flow to the face.  You may develop spider looking lines of the face, neck, arms, and chest. These go away after the baby is born. HOME CARE INSTRUCTIONS   It is extremely important to avoid all smoking, herbs, alcohol, and unprescribed drugs during your pregnancy. These chemicals affect the formation and growth of the baby. Avoid these chemicals throughout the pregnancy to ensure the delivery of a healthy infant.  Most of your home care instructions are the same as suggested for the first trimester of your pregnancy.  Keep your caregiver's appointments. Follow your caregiver's instructions regarding medicine use, exercise, and diet.  During pregnancy, you are providing food for you and your baby. Continue to eat regular, well-balanced meals. Choose foods such as meat, fish, milk and other low fat dairy products, vegetables, fruits, and whole-grain breads and cereals. Your caregiver will tell you of the ideal weight gain.  A physical sexual relationship may be continued up until near the end of pregnancy if there are no other problems. Problems could include early (premature) leaking of amniotic fluid from the membranes, vaginal bleeding, abdominal pain, or other medical or pregnancy problems.  Exercise regularly if there are no restrictions. Check with your caregiver if you are unsure of the safety of some of your exercises. The greatest weight gain will occur in the last 2 trimesters of pregnancy. Exercise will help you:  Control your weight.  Get you in shape for labor and delivery.  Lose weight after you have the baby.  Wear  a good support or jogging bra for breast tenderness during pregnancy. This may help if worn during sleep. Pads or tissues may be used in the bra if you are leaking colostrum.  Do not use hot tubs, steam rooms or saunas throughout the pregnancy.  Wear your seat belt at all times when driving. This protects you and your baby if you are in an accident.  Avoid raw meat, uncooked cheese, cat litter boxes, and soil used by cats. These carry germs that can cause birth defects in the baby.  The second trimester is also a good time to visit your dentist for your dental health if this has not been done yet. Getting your teeth cleaned is okay. Use a soft toothbrush. Brush gently during pregnancy.  It is easier to leak urine during pregnancy. Tightening up and strengthening the pelvic muscles will help with this problem. Practice stopping your urination while you are going to the bathroom.  These are the same muscles you need to strengthen. It is also the muscles you would use as if you were trying to stop from passing gas. You can practice tightening these muscles up 10 times a set and repeating this about 3 times per day. Once you know what muscles to tighten up, do not perform these exercises during urination. It is more likely to contribute to an infection by backing up the urine.  Ask for help if you have financial, counseling, or nutritional needs during pregnancy. Your caregiver will be able to offer counseling for these needs as well as refer you for other special needs.  Your skin may become oily. If so, wash your face with mild soap, use non-greasy moisturizer and oil or cream based makeup. MEDICINES AND DRUG USE IN PREGNANCY  Take prenatal vitamins as directed. The vitamin should contain 1 milligram of folic acid. Keep all vitamins out of reach of children. Only a couple vitamins or tablets containing iron may be fatal to a baby or young child when ingested.  Avoid use of all medicines, including herbs, over-the-counter medicines, not prescribed or suggested by your caregiver. Only take over-the-counter or prescription medicines for pain, discomfort, or fever as directed by your caregiver. Do not use aspirin.  Let your caregiver also know about herbs you may be using.  Alcohol is related to a number of birth defects. This includes fetal alcohol syndrome. All alcohol, in any form, should be avoided completely. Smoking will cause low birth rate and premature babies.  Street or illegal drugs are very harmful to the baby. They are absolutely forbidden. A baby born to an addicted mother will be addicted at birth. The baby will go through the same withdrawal an adult does. SEEK MEDICAL CARE IF:  You have any concerns or worries during your pregnancy. It is better to call with your questions if you feel they cannot wait, rather than worry about them. SEEK IMMEDIATE MEDICAL CARE  IF:   An unexplained oral temperature above 102 F (38.9 C) develops, or as your caregiver suggests.  You have leaking of fluid from the vagina (birth canal). If leaking membranes are suspected, take your temperature and tell your caregiver of this when you call.  There is vaginal spotting, bleeding, or passing clots. Tell your caregiver of the amount and how many pads are used. Light spotting in pregnancy is common, especially following intercourse.  You develop a bad smelling vaginal discharge with a change in the color from clear to white.  You continue to feel  sick to your stomach (nauseated) and have no relief from remedies suggested. You vomit blood or coffee ground-like materials.  You lose more than 2 pounds of weight or gain more than 2 pounds of weight over 1 week, or as suggested by your caregiver.  You notice swelling of your face, hands, feet, or legs.  You get exposed to [place] measles and have never had them.  You are exposed to fifth disease or chickenpox.  You develop belly (abdominal) pain. Round ligament discomfort is a common non-cancerous (benign) cause of abdominal pain in pregnancy. Your caregiver still must evaluate you.  You develop a bad headache that does not go away.  You develop fever, diarrhea, pain with urination, or shortness of breath.  You develop visual problems, blurry, or double vision.  You fall or are in a car accident or any kind of trauma.  There is mental or physical violence at home. Document Released: 09/09/2001 Document Revised: 06/09/2012 Document Reviewed: 03/14/2009 [facility] Patient Information 2014 [place], [place]. Hypertension During Pregnancy Hypertension is also called high blood pressure. It can occur at any time in life and during pregnancy. When you have hypertension, there is extra pressure inside your blood vessels that carry blood from the heart to the rest of your body (arteries). Hypertension during pregnancy can cause  problems for you and your baby. Your baby might not weigh as much as it should at birth or might be born early (premature). Very bad cases of hypertension during pregnancy can be life-threatening.  There are different types of hypertension during pregnancy.   Chronic hypertension. This happens when a woman has hypertension before pregnancy and it continues during pregnancy.  Gestational hypertension. This is when hypertension develops during pregnancy.  Preeclampsia or toxemia of pregnancy. This is a very serious type of hypertension that develops only during pregnancy. It is a disease that affects the whole body (systemic) and can be very dangerous for both mother and baby.  Gestational hypertension and preeclampsia usually go away after your baby is born. Blood pressure generally stabilizes within 6 weeks. Women who have hypertension during pregnancy have a greater chance of developing hypertension later in life or with future pregnancies. UNDERSTANDING BLOOD PRESSURE Blood pressure moves blood in your body. Sometimes, the force that moves the blood becomes too strong.  A blood pressure reading is given in 2 numbers and looks like a fraction.  The top number is called the systolic pressure. When your heart beats, it forces more blood to flow through the arteries. Pressure inside the arteries goes up.  The bottom number is the diastolic pressure. Pressure goes down between beats. That is when the heart is resting.  You may have hypertension if:  Your systolic blood pressure is above 140.  Your diastolic pressure is above 90. RISK FACTORS Some factors make you more likely to develop hypertension during pregnancy. Risk factors include:  Having hypertension before pregnancy.  Having hypertension during a previous pregnancy.  Being overweight.  Being older than 40.  Being pregnant with more than 1 baby (multiples).  Having diabetes or kidney problems. SYMPTOMS Chronic and  gestational hypertension may not cause symptoms. Preeclampsia has symptoms, which may include:  Increased protein in your urine. Your caregiver will check for this at every prenatal visit.  Swelling of your hands and face.  Rapid weight gain.  Headaches.  Visual changes.  Being bothered by light.  Abdominal pain, especially in the right upper area.  Chest pain.  Shortness of breath.  Increased reflexes.  Seizures. Seizures occur with a more severe form of preeclampsia, called eclampsia. DIAGNOSIS   You may be diagnosed with hypertension during pregnancy during a regular prenatal exam. At each visit, tests may include:  Blood pressure checks.  A urine test to check for protein in your urine.  The type of hypertension you are diagnosed with depends on when you developed it. It also depends on your specific blood pressure reading.  Developing hypertension before 20 weeks of pregnancy is consistent with chronic hypertension.  Developing hypertension after 20 weeks of pregnancy is consistent with gestational hypertension.  Hypertension with increased urinary protein is diagnosed as preeclampsia.  Blood pressure measurements that stay above 160 systolic or 110 diastolic are a sign of severe preeclampsia. TREATMENT Treatment for hypertension during pregnancy varies. Treatment depends on the type of hypertension and how serious it is.  If you take medicine for chronic hypertension, you may need to switch medicines.  Drugs called ACE inhibitors should not be taken during pregnancy.  Low-dose aspirin may be suggested for women who have risk factors for preeclampsia.  If you have gestational hypertension, you may need to take a blood pressure medicine that is safe during pregnancy. Your caregiver will recommend the appropriate medicine.  If you have severe preeclampsia, you may need to be in the hospital. Caregivers will watch you and the baby very closely. You also may need  to take medicine (magnesium sulfate) to prevent seizures and lower blood pressure.  Sometimes an early delivery is needed. This may be the case if the condition worsens. It would be done to protect you and the baby. The only cure for preeclampsia is delivery. HOME CARE INSTRUCTIONS  Schedule and keep all of your regular prenatal care.  Follow your caregiver's instructions for taking medicines. Tell your caregiver about all medicines you take. This includes over-the-counter medicines.  Eat as little salt as possible.  Get regular exercise.  Do not drink alcohol.  Do not use tobacco products.  Do not drink products with caffeine.  Lie on your left side when resting.  Tell your doctor if you have any preeclampsia symptoms. SEEK IMMEDIATE MEDICAL CARE IF:  You have severe abdominal pain.  You have sudden swelling in the hands, ankles, or face.  You gain 4 pounds (1.8 kg) or more in 1 week.  You vomit repeatedly.  You have vaginal bleeding.  You do not feel the baby moving as much.  You have a headache.  You have blurred or double vision.  You have muscle twitching or spasms.  You have shortness of breath.  You have blue fingernails and lips.  You have blood in your urine. MAKE SURE YOU:  Understand these instructions.  Will watch your condition.  Will get help right away if you are not doing well. Document Released: 06/03/2011 Document Revised: 12/08/2011 Document Reviewed: 06/03/2011 [facility] Patient Information 2014 [place], [place].

## 2013-07-06 NOTE — Progress Notes (Signed)
Pulse-  88 Flu vaccine consented Pt reports of feeling of depression

## 2013-07-06 NOTE — Addendum Note (Signed)
Addended by: Toula Moos on: 07/06/2013 12:00 PM   Modules accepted: Orders

## 2013-07-08 NOTE — Addendum Note (Signed)
Addended by: Franchot Mimes on: 07/08/2013 11:55 AM   Modules accepted: Orders

## 2013-07-09 LAB — PROTEIN, URINE, 24 HOUR: Protein, 24H Urine: 113 mg/d — ABNORMAL HIGH (ref 50–100)

## 2013-07-09 LAB — CREATININE CLEARANCE, URINE, 24 HOUR
Creatinine Clearance: 239 mL/min — ABNORMAL HIGH (ref 75–115)
Creatinine, 24H Ur: 2202 mg/d — ABNORMAL HIGH (ref 700–1800)
Creatinine, Urine: 195.7 mg/dL
Creatinine: 0.64 mg/dL (ref 0.50–1.10)

## 2013-07-13 ENCOUNTER — Telehealth: Payer: Self-pay | Admitting: *Deleted

## 2013-07-13 DIAGNOSIS — Z3402 Encounter for supervision of normal first pregnancy, second trimester: Secondary | ICD-10-CM

## 2013-07-13 NOTE — Telephone Encounter (Signed)
Patient called to see if we had tried to call her because her phone is not working. I told her that we had not tried to call her to the best of my knowledge and we don't currently have any messages from the doctors about her. Patient scheduled to followup next week. We will inform her of anything at that time or send letter. Patient agreeable to this.

## 2013-07-21 ENCOUNTER — Ambulatory Visit (INDEPENDENT_AMBULATORY_CARE_PROVIDER_SITE_OTHER): Payer: Medicaid Other | Admitting: Family

## 2013-07-21 VITALS — BP 155/94 | Temp 98.1°F | Wt 317.7 lb

## 2013-07-21 DIAGNOSIS — O162 Unspecified maternal hypertension, second trimester: Secondary | ICD-10-CM

## 2013-07-21 DIAGNOSIS — O169 Unspecified maternal hypertension, unspecified trimester: Secondary | ICD-10-CM

## 2013-07-21 LAB — THYROID PANEL
T3 Uptake: 20.2 % — ABNORMAL LOW (ref 22.5–37.0)
T4, Total: 14 ug/dL — ABNORMAL HIGH (ref 5.0–12.5)

## 2013-07-21 LAB — POCT URINALYSIS DIP (DEVICE)
Glucose, UA: NEGATIVE mg/dL
Nitrite: NEGATIVE
Urobilinogen, UA: 1 mg/dL (ref 0.0–1.0)

## 2013-07-21 NOTE — Progress Notes (Signed)
Reviewed NT and TSH results; will obtain thyroid panel and AFP today.  Reviewed blood pressures with Dr. Macon Large > no meds today > return in one week for BP eval.

## 2013-07-21 NOTE — Progress Notes (Signed)
Pulse- 98 

## 2013-07-23 ENCOUNTER — Emergency Department (HOSPITAL_COMMUNITY)
Admission: EM | Admit: 2013-07-23 | Discharge: 2013-07-23 | Disposition: A | Discharge status: Home / Self Care | Payer: Medicaid Other | Attending: Emergency Medicine | Admitting: Emergency Medicine

## 2013-07-23 ENCOUNTER — Encounter (HOSPITAL_COMMUNITY): Payer: Self-pay | Admitting: Emergency Medicine

## 2013-07-23 DIAGNOSIS — Z331 Pregnant state, incidental: Secondary | ICD-10-CM | POA: Insufficient documentation

## 2013-07-23 DIAGNOSIS — L02211 Cutaneous abscess of abdominal wall: Secondary | ICD-10-CM

## 2013-07-23 DIAGNOSIS — Z79899 Other long term (current) drug therapy: Secondary | ICD-10-CM | POA: Insufficient documentation

## 2013-07-23 DIAGNOSIS — Z87891 Personal history of nicotine dependence: Secondary | ICD-10-CM | POA: Insufficient documentation

## 2013-07-23 DIAGNOSIS — L02219 Cutaneous abscess of trunk, unspecified: Secondary | ICD-10-CM | POA: Insufficient documentation

## 2013-07-23 LAB — CULTURE, OB URINE: Colony Count: 55000

## 2013-07-23 MED ORDER — HYDROCODONE-ACETAMINOPHEN 5-325 MG PO TABS: 2.0000 | ORAL_TABLET | Freq: Four times a day (QID) | ORAL | Status: DC | PRN

## 2013-07-23 MED ORDER — LIDOCAINE-EPINEPHRINE 2 %-1:100000 IJ SOLN
20.0000 mL | Freq: Once | INTRAMUSCULAR | Status: AC
  Administered 2013-07-23: 20 mL
  Filled 2013-07-23: qty 20

## 2013-07-23 MED ORDER — FENTANYL CITRATE 0.05 MG/ML IJ SOLN
100.0000 ug | Freq: Once | INTRAMUSCULAR | Status: AC
  Administered 2013-07-23: 100 ug via NASAL
  Filled 2013-07-23: qty 2

## 2013-07-23 NOTE — ED Provider Notes (Signed)
CSN: [id number]     Arrival date & time 07/23/13  0806 History   First MD Initiated Contact with Patient 07/23/13 [phone]     Chief Complaint  Patient presents with  . Abscess   (Consider location/radiation/quality/duration/timing/severity/associated sxs/prior Treatment) HPI This 22 year old pregnant female 69 weeks estimated gestational age with her first pregnancy she presents with an approximately three-day history gradual onset abdominal wall abscess constant with severe tenderness and pain gradually worsening the last few days with no fever no chest pain no shortness breath no vomiting no abdominal pain no diarrhea no vaginal bleeding no vaginal discharge no dysuria no extremity pains no focal weakness or numbness no treatment prior to arrival the pain is worse with palpation and movement it is well localized to her abdominal wall skin inferior to her umbilicus with mild redness and purulence to the skin in that area. Past Medical History  Diagnosis Date  . Stye   . Infection     UTI   Past Surgical History  Procedure Laterality Date  . No past surgeries     Family History  Problem Relation Age of Onset  . Diabetes Mother   . Diabetes Maternal Grandmother   . Diabetes Maternal Grandfather    History  Substance Use Topics  . Smoking status: Former Smoker -- 0.25 packs/day for 2 years    Types: Cigarettes    Quit date: 05/04/2013  . Smokeless tobacco: Never Used  . Alcohol Use: No   OB History   Grav Para Term Preterm Abortions TAB SAB Ect Mult Living   1              Review of Systems 10 Systems reviewed and are negative for acute change except as noted in the HPI. Allergies  Review of patient's allergies indicates no known allergies.  Home Medications   Current Outpatient Rx  Name  Route  Sig  Dispense  Refill  . acetaminophen (TYLENOL) 500 MG tablet   Oral   Take 500 mg by mouth every 6 (six) hours as needed for pain.         . Prenatal Vit-Min-FA-Fish Oil  ([facility] PRENATAL GUMMY PO)   Oral   Take 2 tablets by mouth daily.         Marland Kitchen HYDROcodone-acetaminophen (NORCO) 5-325 MG per tablet   Oral   Take 2 tablets by mouth every 6 (six) hours as needed for pain.   10 tablet   0   . labetalol (NORMODYNE) 100 MG tablet   Oral   Take 1 tablet (100 mg total) by mouth 2 (two) times daily.   60 tablet   2    BP 149/80  Pulse 71  Temp(Src) 97.5 F (36.4 C) (Oral)  Resp 16  Ht 5\' 8"  (1.727 m)  Wt 315 lb (142.883 kg)  BMI 47.91 kg/m2  SpO2 73%  LMP 03/26/2013 Physical Exam  Nursing note and vitals reviewed. Constitutional:  Awake, alert, nontoxic appearance.  HENT:  Head: Atraumatic.  Eyes: Right eye exhibits no discharge. Left eye exhibits no discharge.  Neck: Neck supple.  Cardiovascular: Normal rate and regular rhythm.   No murmur heard. Pulmonary/Chest: Effort normal and breath sounds normal. No respiratory distress. She has no wheezes. She has no rales. She exhibits no tenderness.  Abdominal: Soft. Bowel sounds are normal. She exhibits no distension and no mass. There is tenderness. There is no rebound and no guarding.  The abdomen is nontender except for the abscess well localized inferior  to her umbilicus on her midline abdominal wall with approximately 3 cm diameter area of fluctuance mild erythema, tenderness without surrounding cellulitis suggestive of localized abscess with limited bedside ultrasound confirming subcutaneous fluid collection consistent with subcutaneous abscess  Musculoskeletal: She exhibits no tenderness.  Baseline ROM, no obvious new focal weakness.  Neurological:  Mental status and motor strength appears baseline for patient and situation.  Skin: No rash noted.  Psychiatric: She has a normal mood and affect.    ED Course  Procedures (including critical care time) INCISION AND DRAINAGE Performed by: Hurman Horn Consent: Verbal consent obtained. Risks and benefits: risks, benefits and alternatives were  discussed Time out performed prior to procedure Type: abscess Body area: Abdominal wall Anesthesia: local infiltration Incision was made with a scalpel. Local anesthetic: lidocaine 2% with epinephrine Anesthetic total: 10ml Complexity: complex Blunt dissection to break up loculations Drainage: purulent Drainage amount: Copious  Packing material: None  Patient tolerance: Patient tolerated the procedure well with no immediate complications.   Labs Review Labs Reviewed - No data to display Imaging Review No results found.  EKG Interpretation   None       MDM   1. Abscess of abdominal wall    I doubt any other EMC precluding discharge at this time including, but not necessarily limited to the following:sepsis.    Hurman Horn, MD 07/31/13 2035

## 2013-07-23 NOTE — ED Notes (Signed)
Pt c/o abscess to mid lower abd that started Thursday evening. Denies any drainage.

## 2013-07-26 ENCOUNTER — Encounter: Payer: Self-pay | Admitting: Family

## 2013-07-26 LAB — AFP, QUAD SCREEN
AFP: 14.5 IU/mL
Age Alone: 1:1140 {titer}
Down Syndrome Scr Risk Est: 1:541 {titer}
HCG, Total: 22503 m[IU]/mL
MoM for INH: 0.9
Open Spina bifida: NEGATIVE
Trisomy 18 (Edward) Syndrome Interp.: 1:21400 {titer}

## 2013-07-28 ENCOUNTER — Ambulatory Visit (INDEPENDENT_AMBULATORY_CARE_PROVIDER_SITE_OTHER): Payer: Medicaid Other | Admitting: Advanced Practice Midwife

## 2013-07-28 VITALS — BP 161/91 | Wt 313.8 lb

## 2013-07-28 DIAGNOSIS — O161 Unspecified maternal hypertension, first trimester: Secondary | ICD-10-CM

## 2013-07-28 DIAGNOSIS — O132 Gestational [pregnancy-induced] hypertension without significant proteinuria, second trimester: Secondary | ICD-10-CM

## 2013-07-28 DIAGNOSIS — O139 Gestational [pregnancy-induced] hypertension without significant proteinuria, unspecified trimester: Secondary | ICD-10-CM

## 2013-07-28 LAB — POCT URINALYSIS DIP (DEVICE)
Hgb urine dipstick: NEGATIVE
Protein, ur: NEGATIVE mg/dL
Specific Gravity, Urine: >=1.03 (ref 1.005–1.030)
Urobilinogen, UA: 0.2 mg/dL (ref 0.0–1.0)

## 2013-07-28 MED ORDER — LABETALOL HCL 100 MG PO TABS: 100.0000 mg | ORAL_TABLET | Freq: Two times a day (BID) | ORAL | Status: DC

## 2013-07-28 NOTE — Progress Notes (Signed)
Doing well.  Denies vaginal bleeding, LOF, regular contractions. Labetalol 100 mg BID sent to pt pharmacy.  Discussed medication, risks of HTN in pregnancy with pt.

## 2013-07-28 NOTE — Progress Notes (Signed)
Pulse: 104

## 2013-08-10 ENCOUNTER — Ambulatory Visit (INDEPENDENT_AMBULATORY_CARE_PROVIDER_SITE_OTHER): Payer: Medicaid Other | Admitting: Obstetrics and Gynecology

## 2013-08-10 ENCOUNTER — Ambulatory Visit (HOSPITAL_COMMUNITY): Admission: RE | Admit: 2013-08-10 | Payer: Medicaid Other | Source: Ambulatory Visit

## 2013-08-10 ENCOUNTER — Ambulatory Visit (HOSPITAL_COMMUNITY)
Admission: RE | Admit: 2013-08-10 | Discharge: 2013-08-10 | Disposition: A | Discharge status: Home / Self Care | Payer: Medicaid Other | Source: Ambulatory Visit | Attending: Advanced Practice Midwife | Admitting: Advanced Practice Midwife

## 2013-08-10 VITALS — BP 133/90 | Temp 98.2°F | Wt 317.1 lb

## 2013-08-10 DIAGNOSIS — O99212 Obesity complicating pregnancy, second trimester: Secondary | ICD-10-CM

## 2013-08-10 DIAGNOSIS — O10019 Pre-existing essential hypertension complicating pregnancy, unspecified trimester: Secondary | ICD-10-CM | POA: Insufficient documentation

## 2013-08-10 DIAGNOSIS — E669 Obesity, unspecified: Secondary | ICD-10-CM | POA: Insufficient documentation

## 2013-08-10 DIAGNOSIS — Z1389 Encounter for screening for other disorder: Secondary | ICD-10-CM | POA: Insufficient documentation

## 2013-08-10 DIAGNOSIS — Z363 Encounter for antenatal screening for malformations: Secondary | ICD-10-CM | POA: Insufficient documentation

## 2013-08-10 DIAGNOSIS — B373 Candidiasis of vulva and vagina: Secondary | ICD-10-CM

## 2013-08-10 DIAGNOSIS — B3731 Acute candidiasis of vulva and vagina: Secondary | ICD-10-CM | POA: Insufficient documentation

## 2013-08-10 DIAGNOSIS — N898 Other specified noninflammatory disorders of vagina: Secondary | ICD-10-CM

## 2013-08-10 DIAGNOSIS — O358XX0 Maternal care for other (suspected) fetal abnormality and damage, not applicable or unspecified: Secondary | ICD-10-CM | POA: Insufficient documentation

## 2013-08-10 DIAGNOSIS — Z3400 Encounter for supervision of normal first pregnancy, unspecified trimester: Secondary | ICD-10-CM

## 2013-08-10 DIAGNOSIS — O9989 Other specified diseases and conditions complicating pregnancy, childbirth and the puerperium: Secondary | ICD-10-CM

## 2013-08-10 DIAGNOSIS — O99330 Smoking (tobacco) complicating pregnancy, unspecified trimester: Secondary | ICD-10-CM | POA: Insufficient documentation

## 2013-08-10 DIAGNOSIS — O132 Gestational [pregnancy-induced] hypertension without significant proteinuria, second trimester: Secondary | ICD-10-CM

## 2013-08-10 MED ORDER — FLUCONAZOLE 150 MG PO TABS: 150.0000 mg | ORAL_TABLET | Freq: Once | ORAL | Status: DC

## 2013-08-10 NOTE — Progress Notes (Signed)
Pulse- 75   Pt reports bilateral labia swelling and hurting before penetration

## 2013-08-10 NOTE — Patient Instructions (Signed)
Monilial Vaginitis  Vaginitis in a soreness, swelling and redness (inflammation) of the vagina and vulva. Monilial vaginitis is not a sexually transmitted infection.  CAUSES   Yeast vaginitis is caused by yeast (candida) that is normally found in your vagina. With a yeast infection, the candida has overgrown in number to a point that upsets the chemical balance.  SYMPTOMS   · White, thick vaginal discharge.  · Swelling, itching, redness and irritation of the vagina and possibly the lips of the vagina (vulva).  · Burning or painful urination.  · Painful intercourse.  DIAGNOSIS   Things that may contribute to monilial vaginitis are:  · Postmenopausal and virginal states.  · Pregnancy.  · Infections.  · Being tired, sick or stressed, especially if you had monilial vaginitis in the past.  · Diabetes. Good control will help lower the chance.  · Birth control pills.  · Tight fitting garments.  · Using bubble bath, feminine sprays, douches or deodorant tampons.  · Taking certain medications that kill germs (antibiotics).  · Sporadic recurrence can occur if you become ill.  TREATMENT   Your caregiver will give you medication.  · There are several kinds of anti monilial vaginal creams and suppositories specific for monilial vaginitis. For recurrent yeast infections, use a suppository or cream in the vagina 2 times a week, or as directed.  · Anti-monilial or steroid cream for the itching or irritation of the vulva may also be used. Get your caregiver's permission.  · Painting the vagina with methylene blue solution may help if the monilial cream does not work.  · Eating yogurt may help prevent monilial vaginitis.  HOME CARE INSTRUCTIONS   · Finish all medication as prescribed.  · Do not have sex until treatment is completed or after your caregiver tells you it is okay.  · Take warm sitz baths.  · Do not douche.  · Do not use tampons, especially scented ones.  · Wear cotton underwear.  · Avoid tight pants and panty  hose.  · Tell your sexual partner that you have a yeast infection. They should go to their caregiver if they have symptoms such as mild rash or itching.  · Your sexual partner should be treated as well if your infection is difficult to eliminate.  · Practice safer sex. Use condoms.  · Some vaginal medications cause latex condoms to fail. Vaginal medications that harm condoms are:  · Cleocin cream.  · Butoconazole (Femstat®).  · Terconazole (Terazol®) vaginal suppository.  · Miconazole (Monistat®) (may be purchased over the counter).  SEEK MEDICAL CARE IF:   · You have a temperature by mouth above 102° F (38.9° C).  · The infection is getting worse after 2 days of treatment.  · The infection is not getting better after 3 days of treatment.  · You develop blisters in or around your vagina.  · You develop vaginal bleeding, and it is not your menstrual period.  · You have pain when you urinate.  · You develop intestinal problems.  · You have pain with sexual intercourse.  Document Released: 06/25/2005 Document Revised: 12/08/2011 Document Reviewed: 03/09/2009  ExitCare® Patient Information ©2014 [employer].

## 2013-08-10 NOTE — Progress Notes (Signed)
On labetalol 100 mg bid. [place] just before visit and report not yet available.  SPEC: vulvar irritation, not swollen. Curdy white discharge. WP sent and Rx Diflucan F/U [facility].

## 2013-08-10 NOTE — Addendum Note (Signed)
Addended by: Gerome Apley on: 08/10/2013 12:14 PM   Modules accepted: Orders

## 2013-08-11 LAB — WET PREP, GENITAL

## 2013-08-17 ENCOUNTER — Telehealth: Payer: Self-pay

## 2013-08-17 ENCOUNTER — Encounter: Payer: Self-pay | Admitting: *Deleted

## 2013-08-17 NOTE — Telephone Encounter (Signed)
Pt called and stated that she is having a head cold and congestion and wants to know what she can take. Called pt and informed pt that she can take otc Robutussin Plain and make sure that is plain due to her having HTN, Benadryl, and Tylenol Allergy. I advised pt that come Monday she still does not have any relief to please give [place] a call back.  Pt stated understanding with no further questions.

## 2013-09-01 ENCOUNTER — Ambulatory Visit (INDEPENDENT_AMBULATORY_CARE_PROVIDER_SITE_OTHER): Payer: Medicaid Other | Admitting: Family

## 2013-09-01 VITALS — BP 155/84 | Wt 315.2 lb

## 2013-09-01 DIAGNOSIS — E669 Obesity, unspecified: Secondary | ICD-10-CM

## 2013-09-01 DIAGNOSIS — Z3402 Encounter for supervision of normal first pregnancy, second trimester: Secondary | ICD-10-CM

## 2013-09-01 DIAGNOSIS — O169 Unspecified maternal hypertension, unspecified trimester: Secondary | ICD-10-CM

## 2013-09-01 LAB — POCT URINALYSIS DIP (DEVICE)
Leukocytes, UA: NEGATIVE
Nitrite: NEGATIVE
Protein, ur: NEGATIVE mg/dL
pH: 7 (ref 5.0–8.0)

## 2013-09-01 MED ORDER — LABETALOL HCL 100 MG PO TABS: 200.0000 mg | ORAL_TABLET | Freq: Two times a day (BID) | ORAL | Status: DC

## 2013-09-01 NOTE — Progress Notes (Signed)
P=107 Pt reports taking Labetalol twice daily.

## 2013-09-01 NOTE — Progress Notes (Signed)
No questions or concerns.  Pt states not taking labetalol as directed because it gives her a headache.  Explained importance of good blood pressure management.  Will take 200 mg BID.  Scheduled growth [place] in 2 weeks.  Referral to MFM for consult regarding abnormal thyroid levels.  Redraw free t3 and free t4.

## 2013-09-01 NOTE — Progress Notes (Signed)
U/S scheduled 09/15/13 at 945 am. MFM consult scheduled 09/13/13 at 9 am.

## 2013-09-13 ENCOUNTER — Ambulatory Visit (HOSPITAL_COMMUNITY)
Admission: RE | Admit: 2013-09-13 | Discharge: 2013-09-13 | Disposition: A | Discharge status: Home / Self Care | Payer: Medicaid Other | Source: Ambulatory Visit | Attending: Obstetrics & Gynecology | Admitting: Obstetrics & Gynecology

## 2013-09-13 ENCOUNTER — Encounter (HOSPITAL_COMMUNITY): Payer: Self-pay

## 2013-09-13 VITALS — BP 157/93 | HR 83 | Wt 322.0 lb

## 2013-09-13 DIAGNOSIS — O162 Unspecified maternal hypertension, second trimester: Secondary | ICD-10-CM

## 2013-09-13 DIAGNOSIS — R7989 Other specified abnormal findings of blood chemistry: Secondary | ICD-10-CM

## 2013-09-13 DIAGNOSIS — Z3402 Encounter for supervision of normal first pregnancy, second trimester: Secondary | ICD-10-CM

## 2013-09-13 HISTORY — DX: Essential (primary) hypertension: I10

## 2013-09-15 ENCOUNTER — Ambulatory Visit (HOSPITAL_COMMUNITY)
Admission: RE | Admit: 2013-09-15 | Discharge: 2013-09-15 | Disposition: A | Discharge status: Home / Self Care | Payer: Medicaid Other | Source: Ambulatory Visit | Attending: Family | Admitting: Family

## 2013-09-15 ENCOUNTER — Ambulatory Visit (INDEPENDENT_AMBULATORY_CARE_PROVIDER_SITE_OTHER): Payer: Medicaid Other | Admitting: Obstetrics & Gynecology

## 2013-09-15 VITALS — BP 146/83 | Temp 97.9°F | Wt 322.5 lb

## 2013-09-15 DIAGNOSIS — Z3689 Encounter for other specified antenatal screening: Secondary | ICD-10-CM | POA: Insufficient documentation

## 2013-09-15 DIAGNOSIS — E669 Obesity, unspecified: Secondary | ICD-10-CM

## 2013-09-15 DIAGNOSIS — Z3402 Encounter for supervision of normal first pregnancy, second trimester: Secondary | ICD-10-CM

## 2013-09-15 DIAGNOSIS — O169 Unspecified maternal hypertension, unspecified trimester: Secondary | ICD-10-CM

## 2013-09-15 DIAGNOSIS — O10019 Pre-existing essential hypertension complicating pregnancy, unspecified trimester: Secondary | ICD-10-CM | POA: Insufficient documentation

## 2013-09-15 LAB — POCT URINALYSIS DIP (DEVICE)
Nitrite: NEGATIVE
Urobilinogen, UA: 0.2 mg/dL (ref 0.0–1.0)
pH: 6.5 (ref 5.0–8.0)

## 2013-09-15 NOTE — Patient Instructions (Signed)
Levonorgestrel intrauterine device (IUD) What is this medicine? LEVONORGESTREL IUD (LEE voe nor jes trel) is a contraceptive (birth control) device. The device is placed inside the uterus by a healthcare professional. It is used to prevent pregnancy and can also be used to treat heavy bleeding that occurs during your period. Depending on the device, it can be used for 3 to 5 years. This medicine may be used for other purposes; ask your health care provider or pharmacist if you have questions. COMMON BRAND NAME(S): Mirena, Skyla What should I tell my health care provider before I take this medicine? They need to know if you have any of these conditions: -abnormal Pap smear -cancer of the breast, uterus, or cervix -diabetes -endometritis -genital or pelvic infection now or in the past -have more than one sexual partner or your partner has more than one partner -heart disease -history of an ectopic or tubal pregnancy -immune system problems -IUD in place -liver disease or tumor -problems with blood clots or take blood-thinners -use intravenous drugs -uterus of unusual shape -vaginal bleeding that has not been explained -an unusual or allergic reaction to levonorgestrel, other hormones, silicone, or polyethylene, medicines, foods, dyes, or preservatives -pregnant or trying to get pregnant -breast-feeding How should I use this medicine? This device is placed inside the uterus by a health care professional. Talk to your pediatrician regarding the use of this medicine in children. Special care may be needed. Overdosage: If you think you have taken too much of this medicine contact a poison control center or emergency room at once. NOTE: This medicine is only for you. Do not share this medicine with others. What if I miss a dose? This does not apply. What may interact with this medicine? Do not take this medicine with any of the following  medications: -amprenavir -bosentan -fosamprenavir This medicine may also interact with the following medications: -aprepitant -barbiturate medicines for inducing sleep or treating seizures -bexarotene -griseofulvin -medicines to treat seizures like carbamazepine, ethotoin, felbamate, oxcarbazepine, phenytoin, topiramate -modafinil -pioglitazone -rifabutin -rifampin -rifapentine -some medicines to treat HIV infection like atazanavir, indinavir, lopinavir, nelfinavir, tipranavir, ritonavir -St. John's wort -warfarin This list may not describe all possible interactions. Give your health care provider a list of all the medicines, herbs, non-prescription drugs, or dietary supplements you use. Also tell them if you smoke, drink alcohol, or use illegal drugs. Some items may interact with your medicine. What should I watch for while using this medicine? Visit your doctor or health care professional for regular check ups. See your doctor if you or your partner has sexual contact with others, becomes HIV positive, or gets a sexual transmitted disease. This product does not protect you against HIV infection (AIDS) or other sexually transmitted diseases. You can check the placement of the IUD yourself by reaching up to the top of your vagina with clean fingers to feel the threads. Do not pull on the threads. It is a good habit to check placement after each menstrual period. Call your doctor right away if you feel more of the IUD than just the threads or if you cannot feel the threads at all. The IUD may come out by itself. You may become pregnant if the device comes out. If you notice that the IUD has come out use a backup birth control method like condoms and call your health care provider. Using tampons will not change the position of the IUD and are okay to use during your period. What side effects may I   notice from receiving this medicine? Side effects that you should report to your doctor or  health care professional as soon as possible: -allergic reactions like skin rash, itching or hives, swelling of the face, lips, or tongue -fever, flu-like symptoms -genital sores -high blood pressure -no menstrual period for 6 weeks during use -pain, swelling, warmth in the leg -pelvic pain or tenderness -severe or sudden headache -signs of pregnancy -stomach cramping -sudden shortness of breath -trouble with balance, talking, or walking -unusual vaginal bleeding, discharge -yellowing of the eyes or skin Side effects that usually do not require medical attention (report to your doctor or health care professional if they continue or are bothersome): -acne -breast pain -change in sex drive or performance -changes in weight -cramping, dizziness, or faintness while the device is being inserted -headache -irregular menstrual bleeding within first 3 to 6 months of use -nausea This list may not describe all possible side effects. Call your doctor for medical advice about side effects. You may report side effects to FDA at 1-800-FDA-1088. Where should I keep my medicine? This does not apply. NOTE: This sheet is a summary. It may not cover all possible information. If you have questions about this medicine, talk to your doctor, pharmacist, or health care provider.  2014, Elsevier/Gold Standard. (2011-10-16 13:54:04)  

## 2013-09-15 NOTE — Progress Notes (Signed)
P= 89 Pt. States she did not have a chance to take her labetalol today.

## 2013-09-15 NOTE — Progress Notes (Signed)
BP controlled.  Has f/u US today.  Considering IUD.

## 2013-09-18 ENCOUNTER — Encounter: Payer: Self-pay | Admitting: Family

## 2013-09-20 NOTE — Progress Notes (Signed)
MATERNAL FETAL MEDICINE CONSULT  Patient Name: Kayla Bautista Medical Record Number:  [id number] Date of Birth: 1991/07/26 Requesting Physician Name:  Elsie Lincoln, MD Date of Service: 09/13/2013  Chief Complaint Abnormal thyroid lab values, chronic hypertension  History of Present Illness Kayla Bautista was seen today for prenatal diagnosis secondary to low thyroid stimulating hormone and chronic hypertension at the request of Elsie Lincoln, MD.  The patient is a 22 y.o. G78P0,at [redacted]w[redacted]d with an EDD of 12/31/2013, by Last Menstrual Period dating method.    Kayla Bautista is currently maintained on labetalol 200 mg BID, recently increased on 09/01/2013 from 100 mg BID. She reports she "sometimes" forgets to take her medication, as she did the morning of this visit.Baseline 24 hour urine total protein returned 113 g on 07/09/2013. She denies headaches, vision changes, and RUQ pain.  Kayla Bautista denies a history of hyperthyroidism, or any other thyroid problems. She denies nervousness, hair loss, insomnia, weight loss, and constipation. She denies a family history of thyroid disease.  Review of Systems See HPI  Patient History OB History  Gravida Para Term Preterm AB SAB TAB Ectopic Multiple Living  1             # Outcome Date GA Lbr Len/2nd Weight Sex Delivery Anes PTL Lv  1 CUR               Past Medical History  Diagnosis Date  . Stye   . Infection     UTI  . Hypertension     Past Surgical History  Procedure Laterality Date  . No past surgeries      History   Social History  . Marital Status: Single    Spouse Name: N/A    Number of Children: N/A  . Years of Education: N/A   Social History Main Topics  . Smoking status: Former Smoker -- 0.25 packs/day for 2 years    Types: Cigarettes    Quit date: 05/04/2013  . Smokeless tobacco: Never Used  . Alcohol Use: No  . Drug Use: No  . Sexual Activity: Yes    Birth Control/ Protection: None   Other Topics Concern  . None    Social History Narrative  . None    Family History  Problem Relation Age of Onset  . Diabetes Mother   . Diabetes Maternal Grandmother   . Diabetes Maternal Grandfather    In addition, the patient has no family history of mental retardation, birth defects, or genetic diseases.  Physical Examination Filed Vitals:   09/13/13 1011  BP: 157/93  Pulse: 83   No exopthalmos 2+ DTR bilaterally  Assessment and Recommendations - Decreased TSH: TSH on 07/06/13 returned 0.096 and 0.296 on 07/21/13. I have reviewed the documentation for the patient's prenatal visits I I cannot ascertain the reason these labs were obtained. Free T3 and T4 were subsequently ordered and returned within normal limits at 3.6 and 1.2, respectively. We recommend rechecking patient's free T3 and T4 monthly and then again 6 weeks postpartum. We do not recommend any additional testing at this time. If patient develops elevated free T3 and/or T4, please refer her to MFM again and we will initiate therapy in conjunction with an endocrinologist.  - Chronic hypertension: patient is currently hypertensive at 157/93. She forgot to take her labetalol this morning, which apparently is a fairly common occurrence. I recommend titrating the patient's labetalol to achieve a blood pressure of < 140/90. The maximum dose of labetalol is 2400  mg a day, so there is plenty of room to increase Kayla Bautista's current dose. If she cannot tolerate labetalol, I recommend switching to a calcium channel blocker. I see that a baseline 24 hour urine has been collected and returned 113 mg. Serum creatinine returned normal at 0.64. I recommend a CBC, AST and ALT be obtained, and that these labs, along with a 24 hour urine collection and serum creatinine, should be performed each trimester and as clinically indicated based on disease symptoms or the appearance of hypertension.  As chronic hypertension is associated with fetal growth restriction the patient  should have serial growth scans every 4-6 weeks after her anatomic survey at approximately 18 weeks.  Once or twice weekly fetal surveillance should be started at 32 weeks of gestation. Delivery may be planned at 38 - 39 weeks if her disease remains stable.  - Screening for aneuploidy: first trimester screen returned low risk for T21, T13, and T18. AFP returned within normal range.  I spent 30 minutes with Kayla Bautista today of which 50% was face-to-face counseling.  Thank you for referring Kayla Bautista to the [facility].  Please do not hesitate to contact us with questions.   GARDNER, Dierdre Searles, MD  Concur with assessment and plan as outlined above.  Alpha Gula, MD

## 2013-09-27 NOTE — Addendum Note (Signed)
Encounter addended by: Candis Shine, MD on: 09/27/2013  8:34 AM<BR>     Documentation filed: Clinical Notes

## 2013-09-29 NOTE — L&D Delivery Note (Signed)
Delivery Note At 11:00 AM a healthy female was delivered via Vaginal, Spontaneous Delivery (Presentation: OA).  APGAR: 9, 10; weight .   Placenta status: Intact, Spontaneous.  Cord: 3 vessels with the following complications: None.  Cord pH: NA  Anesthesia: Epidural  Episiotomy: None Lacerations: None Suture Repair: na Est. Blood Loss (mL): 250  Upon arrival patient was complete and pushing. She pushed with good maternal effort to deliver a healthy girl. Baby delivered without difficulty, was noticed to have good tone and place on maternal abdomen for oral suctioning, drying and stimulation. Delayed cord clamping performed and cut by FOB. Placenta delivered  intact with 3V cord. Vaginal canal and perineum was inspected and intact. Pitocin was started and uterus massaged until bleeding slowed. Counts of sharps, instruments, and lap pads were all correct.    Mom to postpartum.  Baby to Couplet care / Skin to Skin.  Wenda LowJoyner, James 12/15/2013, 11:29 AM  I have seen the patient with the resident/student and agree with the above.  Tawnya CrookHogan, Heather Donovan

## 2013-10-04 ENCOUNTER — Ambulatory Visit (INDEPENDENT_AMBULATORY_CARE_PROVIDER_SITE_OTHER): Payer: Medicaid Other | Admitting: Obstetrics & Gynecology

## 2013-10-04 ENCOUNTER — Encounter: Payer: Self-pay | Admitting: Obstetrics & Gynecology

## 2013-10-04 VITALS — BP 115/69 | Temp 98.8°F | Wt 317.0 lb

## 2013-10-04 DIAGNOSIS — J069 Acute upper respiratory infection, unspecified: Secondary | ICD-10-CM

## 2013-10-04 DIAGNOSIS — Z3403 Encounter for supervision of normal first pregnancy, third trimester: Secondary | ICD-10-CM

## 2013-10-04 DIAGNOSIS — O139 Gestational [pregnancy-induced] hypertension without significant proteinuria, unspecified trimester: Secondary | ICD-10-CM

## 2013-10-04 DIAGNOSIS — Z3483 Encounter for supervision of other normal pregnancy, third trimester: Secondary | ICD-10-CM

## 2013-10-04 LAB — CBC
HEMATOCRIT: 33.9 % — AB (ref 36.0–46.0)
Hemoglobin: 11.4 g/dL — ABNORMAL LOW (ref 12.0–15.0)
MCH: 27.1 pg (ref 26.0–34.0)
MCHC: 33.6 g/dL (ref 30.0–36.0)
MCV: 80.5 fL (ref 78.0–100.0)
Platelets: 303 10*3/uL (ref 150–400)
RBC: 4.21 MIL/uL (ref 3.87–5.11)
RDW: 14.7 % (ref 11.5–15.5)
WBC: 10.8 10*3/uL — ABNORMAL HIGH (ref 4.0–10.5)

## 2013-10-04 LAB — POCT URINALYSIS DIP (DEVICE)
Glucose, UA: NEGATIVE mg/dL
Nitrite: NEGATIVE
PH: 6.5 (ref 5.0–8.0)
Protein, ur: 30 mg/dL — AB
Specific Gravity, Urine: 1.02 (ref 1.005–1.030)
Urobilinogen, UA: 1 mg/dL (ref 0.0–1.0)

## 2013-10-04 MED ORDER — LABETALOL HCL 200 MG PO TABS: 200.0000 mg | ORAL_TABLET | Freq: Two times a day (BID) | ORAL | Status: DC

## 2013-10-04 NOTE — Progress Notes (Signed)
Pt c/o URI sx.  Otherwise no problems. 28 week labs done today  HEENT:

## 2013-10-04 NOTE — Patient Instructions (Signed)
Glucose Tolerance Test During Pregnancy  The glucose tolerance test (GTT) or 3-hour glucose test can be used to determine if a woman has diabetes that first begins or is first recognized during pregnancy (gestational diabetes). Typically, a GTT is done after you have had a 1-hour glucose test with results that indicate you possibly have gestational diabetes.   The test takes about 3 hours. There will be a series of blood tests after you drink the sugar water solution. You must remain at the testing location to make sure that your blood is drawn on time.   LET YOUR CAREGIVER KNOW ABOUT:  · Allergies to food or medicine.  · Medicines taken, including vitamins, herbs, eyedrops, over-the-counter medicines, and creams.  · Any recent illnesses or infections.  BEFORE THE PROCEDURE  The GTT is a fasting test, meaning you must stop eating for a certain amount of time. The test will be the most accurate if you have not eaten for 8 12 hours before the test. For this reason, it is recommended that you have this test done in the morning before you have breakfast.  PROCEDURE   Do not eat or drink anything but water during the test. When you arrive at the lab, a sample of your blood is taken to get your fasting blood glucose level. After your fasting glucose level is determined, you will be given a sugar water solution to drink. You will be asked to wait in a certain area until your next blood test. The blood tests are done each hour for 3 hours. Stay close to the lab so your blood samples can be taken on time. This is important. If the blood samples are not taken on time, the test will need to be done again on another day.   AFTER THE PROCEDURE  · You can eat and drink as usual.    · Ask when your test results will be ready. Make sure you get your test results. A positive test is considered when two of the four blood test values are equal or above the normal blood glucose level.  Document Released: 03/16/2012 Document Reviewed:  03/16/2012  ExitCare® Patient Information ©2014 [employer].

## 2013-10-04 NOTE — Progress Notes (Signed)
P=112, C/o cold symptoms since last week , taking mucinex and delsym, c./o left ear clogged up.

## 2013-10-05 LAB — GLUCOSE TOLERANCE, 1 HOUR (50G) W/O FASTING: Glucose, 1 Hour GTT: 121 mg/dL (ref 70–140)

## 2013-10-05 LAB — HIV ANTIBODY (ROUTINE TESTING W REFLEX): HIV: NONREACTIVE

## 2013-10-05 LAB — RPR

## 2013-10-18 ENCOUNTER — Encounter: Payer: Self-pay | Admitting: Obstetrics and Gynecology

## 2013-10-18 ENCOUNTER — Ambulatory Visit (INDEPENDENT_AMBULATORY_CARE_PROVIDER_SITE_OTHER): Payer: Medicaid Other | Admitting: Obstetrics and Gynecology

## 2013-10-18 VITALS — BP 137/85 | Temp 98.3°F | Wt 326.0 lb

## 2013-10-18 DIAGNOSIS — O99210 Obesity complicating pregnancy, unspecified trimester: Secondary | ICD-10-CM

## 2013-10-18 DIAGNOSIS — Z23 Encounter for immunization: Secondary | ICD-10-CM

## 2013-10-18 DIAGNOSIS — O169 Unspecified maternal hypertension, unspecified trimester: Secondary | ICD-10-CM

## 2013-10-18 DIAGNOSIS — E669 Obesity, unspecified: Secondary | ICD-10-CM

## 2013-10-18 DIAGNOSIS — Z3400 Encounter for supervision of normal first pregnancy, unspecified trimester: Secondary | ICD-10-CM

## 2013-10-18 DIAGNOSIS — O99212 Obesity complicating pregnancy, second trimester: Secondary | ICD-10-CM

## 2013-10-18 LAB — POCT URINALYSIS DIP (DEVICE)
Glucose, UA: NEGATIVE mg/dL
Hgb urine dipstick: NEGATIVE
Nitrite: NEGATIVE
PH: 6 (ref 5.0–8.0)
Protein, ur: 30 mg/dL — AB
Specific Gravity, Urine: >=1.03 (ref 1.005–1.030)
Urobilinogen, UA: 0.2 mg/dL (ref 0.0–1.0)

## 2013-10-18 MED ORDER — TETANUS-DIPHTH-ACELL PERTUSSIS 5-2.5-18.5 LF-MCG/0.5 IM SUSP
0.5000 mL | Freq: Once | INTRAMUSCULAR | Status: AC
  Administered 2013-10-18: 0.5 mL via INTRAMUSCULAR

## 2013-10-18 NOTE — Addendum Note (Signed)
Addended by: Faythe CasaBELLAMY, Kaniyah Lisby M on: 10/18/2013 12:09 PM   Modules accepted: Orders

## 2013-10-18 NOTE — Progress Notes (Signed)
P=94,  9 lb weight gain noted, denies edema, headaches, visual changes.

## 2013-10-18 NOTE — Progress Notes (Signed)
Patient is doing well, reviewed weight gain with her along with diet and exercise in pregnancy. TDap today. Patient unable to provide urine sample. Will try again before leaving and with po hydration. FM/PTL/Preeclampsia precautions reviewed

## 2013-11-01 ENCOUNTER — Encounter: Payer: Medicaid Other | Admitting: Obstetrics and Gynecology

## 2013-11-08 ENCOUNTER — Ambulatory Visit (INDEPENDENT_AMBULATORY_CARE_PROVIDER_SITE_OTHER): Payer: Medicaid Other | Admitting: Obstetrics & Gynecology

## 2013-11-08 ENCOUNTER — Encounter: Payer: Self-pay | Admitting: Obstetrics & Gynecology

## 2013-11-08 VITALS — BP 135/79 | Wt 333.2 lb

## 2013-11-08 DIAGNOSIS — O169 Unspecified maternal hypertension, unspecified trimester: Secondary | ICD-10-CM

## 2013-11-08 LAB — POCT URINALYSIS DIP (DEVICE)
Bilirubin Urine: NEGATIVE
Glucose, UA: NEGATIVE mg/dL
HGB URINE DIPSTICK: NEGATIVE
Ketones, ur: NEGATIVE mg/dL
Leukocytes, UA: NEGATIVE
Nitrite: NEGATIVE
PROTEIN: NEGATIVE mg/dL
SPECIFIC GRAVITY, URINE: 1.02 (ref 1.005–1.030)
UROBILINOGEN UA: 0.2 mg/dL (ref 0.0–1.0)
pH: 7 (ref 5.0–8.0)

## 2013-11-08 NOTE — Progress Notes (Signed)
sono to check fetal growth No problems

## 2013-11-08 NOTE — Progress Notes (Signed)
Pulse- 98 

## 2013-11-08 NOTE — Patient Instructions (Signed)
Hypertension During Pregnancy Hypertension is also called high blood pressure. It can occur at any time in life and during pregnancy. When you have hypertension, there is extra pressure inside your blood vessels that carry blood from the heart to the rest of your body (arteries). Hypertension during pregnancy can cause problems for you and your baby. Your baby might not weigh as much as it should at birth or might be born early (premature). Very bad cases of hypertension during pregnancy can be life threatening.  Different types of hypertension can occur during pregnancy.   Chronic hypertension. This happens when a woman has hypertension before pregnancy and it continues during pregnancy.  Gestational hypertension. This is when hypertension develops during pregnancy.  Preeclampsia or toxemia of pregnancy. This is a very serious type of hypertension that develops only during pregnancy. It is a disease that affects the whole body (systemic) and can be very dangerous for both mother and baby.  Gestational hypertension and preeclampsia usually go away after your baby is born. Blood pressure generally stabilizes within 6 weeks. Women who have hypertension during pregnancy have a greater chance of developing hypertension later in life or with future pregnancies. RISK FACTORS Some factors make you more likely to develop hypertension during pregnancy. Risk factors include:  Having hypertension before pregnancy.  Having hypertension during a previous pregnancy.  Being overweight.  Being older than 40.  Being pregnant with more than one baby (multiples).  Having diabetes or kidney problems. SIGNS AND SYMPTOMS Chronic and gestational hypertension rarely cause symptoms. Preeclampsia has symptoms, which may include:  Increased protein in your urine. Your health care provider will check for this at every prenatal visit.  Swelling of your hands and face.  Rapid weight gain.  Headaches.  Visual  changes.  Being bothered by light.  Abdominal pain, especially in the right upper area.  Chest pain.  Shortness of breath.  Increased reflexes.  Seizures. Seizures occur with a more severe form of preeclampsia, called eclampsia. DIAGNOSIS   You may be diagnosed with hypertension during a regular prenatal exam. At each visit, tests may include:  Blood pressure checks.  A urine test to check for protein in your urine.  The type of hypertension you are diagnosed with depends on when you developed it. It also depends on your specific blood pressure reading.  Developing hypertension before 20 weeks of pregnancy is consistent with chronic hypertension.  Developing hypertension after 20 weeks of pregnancy is consistent with gestational hypertension.  Hypertension with increased urinary protein is diagnosed as preeclampsia.  Blood pressure measurements that stay above 160 systolic or 110 diastolic are a sign of severe preeclampsia. TREATMENT Treatment for hypertension during pregnancy varies. Treatment depends on the type of hypertension and how serious it is.  If you take medicine for chronic hypertension, you may need to switch medicines.  Drugs called ACE inhibitors should not be taken during pregnancy.  Low-dose aspirin may be suggested for women who have risk factors for preeclampsia.  If you have gestational hypertension, you may need to take a blood pressure medicine that is safe during pregnancy. Your health care provider will recommend the appropriate medicine.  If you have severe preeclampsia, you may need to be in the hospital. Health care providers will watch you and the baby very closely. You also may need to take medicine (magnesium sulfate) to prevent seizures and lower blood pressure.  Sometimes an early delivery is needed. This may be the case if the condition worsens. It would   be done to protect you and the baby. The only cure for preeclampsia is delivery. HOME  CARE INSTRUCTIONS  Schedule and keep all of your regular appointments for prenatal care.  Only take over-the-counter or prescription medicines as directed by your health care provider. Tell your health care provider about all medicines you take.  Eat as little salt as possible.  Get regular exercise.  Do not drink alcohol.  Do not use tobacco products.  Do not drink products with caffeine.  Lie on your left side when resting. SEEK IMMEDIATE MEDICAL CARE IF:  You have severe abdominal pain.  You have sudden swelling in the hands, ankles, or face.  You gain 4 pounds (1.8 kg) or more in 1 week.  You vomit repeatedly.  You have vaginal bleeding.  You do not feel the baby moving as much.  You have a headache.  You have blurred or double vision.  You have muscle twitching or spasms.  You have shortness of breath.  You have blue fingernails and lips.  You have blood in your urine. MAKE SURE YOU:  Understand these instructions.  Will watch your condition.  Will get help right away if you are not doing well or get worse. Document Released: 06/03/2011 Document Revised: 07/06/2013 Document Reviewed: 04/14/2013 ExitCare Patient Information 2014 [employer].  

## 2013-11-14 ENCOUNTER — Ambulatory Visit (HOSPITAL_COMMUNITY)
Admission: RE | Admit: 2013-11-14 | Discharge: 2013-11-14 | Disposition: A | Discharge status: Home / Self Care | Payer: Medicaid Other | Source: Ambulatory Visit | Attending: Obstetrics & Gynecology | Admitting: Obstetrics & Gynecology

## 2013-11-14 DIAGNOSIS — O365990 Maternal care for other known or suspected poor fetal growth, unspecified trimester, not applicable or unspecified: Secondary | ICD-10-CM | POA: Insufficient documentation

## 2013-11-14 DIAGNOSIS — O99210 Obesity complicating pregnancy, unspecified trimester: Secondary | ICD-10-CM

## 2013-11-14 DIAGNOSIS — Z3400 Encounter for supervision of normal first pregnancy, unspecified trimester: Secondary | ICD-10-CM

## 2013-11-14 DIAGNOSIS — O10019 Pre-existing essential hypertension complicating pregnancy, unspecified trimester: Secondary | ICD-10-CM | POA: Insufficient documentation

## 2013-11-14 DIAGNOSIS — O169 Unspecified maternal hypertension, unspecified trimester: Secondary | ICD-10-CM

## 2013-11-14 DIAGNOSIS — E669 Obesity, unspecified: Secondary | ICD-10-CM | POA: Insufficient documentation

## 2013-11-22 ENCOUNTER — Encounter: Payer: Medicaid Other | Admitting: Obstetrics & Gynecology

## 2013-12-06 ENCOUNTER — Encounter: Payer: Self-pay | Admitting: Obstetrics & Gynecology

## 2013-12-06 ENCOUNTER — Encounter (HOSPITAL_COMMUNITY): Payer: Self-pay | Admitting: *Deleted

## 2013-12-06 ENCOUNTER — Ambulatory Visit (INDEPENDENT_AMBULATORY_CARE_PROVIDER_SITE_OTHER): Payer: Medicaid Other | Admitting: Obstetrics & Gynecology

## 2013-12-06 ENCOUNTER — Inpatient Hospital Stay (HOSPITAL_COMMUNITY)
Admission: AD | Admit: 2013-12-06 | Discharge: 2013-12-06 | Disposition: A | Discharge status: Home / Self Care | Payer: Medicaid Other | Source: Ambulatory Visit | Attending: Obstetrics and Gynecology | Admitting: Obstetrics and Gynecology

## 2013-12-06 VITALS — BP 158/88 | Temp 99.0°F | Wt 343.2 lb

## 2013-12-06 DIAGNOSIS — O9989 Other specified diseases and conditions complicating pregnancy, childbirth and the puerperium: Principal | ICD-10-CM

## 2013-12-06 DIAGNOSIS — O26893 Other specified pregnancy related conditions, third trimester: Secondary | ICD-10-CM | POA: Insufficient documentation

## 2013-12-06 DIAGNOSIS — O169 Unspecified maternal hypertension, unspecified trimester: Secondary | ICD-10-CM

## 2013-12-06 DIAGNOSIS — Z3400 Encounter for supervision of normal first pregnancy, unspecified trimester: Secondary | ICD-10-CM

## 2013-12-06 DIAGNOSIS — O99891 Other specified diseases and conditions complicating pregnancy: Secondary | ICD-10-CM | POA: Insufficient documentation

## 2013-12-06 DIAGNOSIS — R030 Elevated blood-pressure reading, without diagnosis of hypertension: Secondary | ICD-10-CM | POA: Insufficient documentation

## 2013-12-06 DIAGNOSIS — Z6791 Unspecified blood type, Rh negative: Secondary | ICD-10-CM | POA: Insufficient documentation

## 2013-12-06 LAB — COMPREHENSIVE METABOLIC PANEL
ALK PHOS: 119 U/L — AB (ref 39–117)
ALT: 15 U/L (ref 0–35)
AST: 19 U/L (ref 0–37)
Albumin: 2.6 g/dL — ABNORMAL LOW (ref 3.5–5.2)
BUN: 10 mg/dL (ref 6–23)
CALCIUM: 10 mg/dL (ref 8.4–10.5)
CO2: 24 mEq/L (ref 19–32)
Chloride: 102 mEq/L (ref 96–112)
Creatinine, Ser: 0.64 mg/dL (ref 0.50–1.10)
GFR calc non Af Amer: >90 mL/min (ref >90)
GLUCOSE: 114 mg/dL — AB (ref 70–99)
Potassium: 4.3 mEq/L (ref 3.7–5.3)
Sodium: 137 mEq/L (ref 137–147)
Total Bilirubin: <0.2 mg/dL — ABNORMAL LOW (ref 0.3–1.2)
Total Protein: 7 g/dL (ref 6.0–8.3)

## 2013-12-06 LAB — POCT URINALYSIS DIP (DEVICE)
Glucose, UA: 100 mg/dL — AB
Leukocytes, UA: NEGATIVE
Nitrite: NEGATIVE
PH: 5.5 (ref 5.0–8.0)
Protein, ur: 100 mg/dL — AB
Specific Gravity, Urine: >=1.03 (ref 1.005–1.030)
Urobilinogen, UA: 0.2 mg/dL (ref 0.0–1.0)

## 2013-12-06 LAB — CBC
HEMATOCRIT: 35.6 % — AB (ref 36.0–46.0)
HEMOGLOBIN: 11.9 g/dL — AB (ref 12.0–15.0)
MCH: 27.7 pg (ref 26.0–34.0)
MCHC: 33.4 g/dL (ref 30.0–36.0)
MCV: 83 fL (ref 78.0–100.0)
Platelets: 216 10*3/uL (ref 150–400)
RBC: 4.29 MIL/uL (ref 3.87–5.11)
RDW: 14.7 % (ref 11.5–15.5)
WBC: 9.9 10*3/uL (ref 4.0–10.5)

## 2013-12-06 LAB — ABO/RH: ABO/RH(D): O NEG

## 2013-12-06 LAB — PROTEIN / CREATININE RATIO, URINE
CREATININE, URINE: 361.6 mg/dL
PROTEIN CREATININE RATIO: 0.19 — AB (ref 0.00–0.15)
Total Protein, Urine: 68.7 mg/dL

## 2013-12-06 LAB — OB RESULTS CONSOLE GC/CHLAMYDIA
CHLAMYDIA, DNA PROBE: NEGATIVE
Gonorrhea: NEGATIVE

## 2013-12-06 LAB — OB RESULTS CONSOLE GBS: STREP GROUP B AG: NEGATIVE

## 2013-12-06 MED ORDER — LABETALOL HCL 300 MG PO TABS: 300.0000 mg | ORAL_TABLET | Freq: Two times a day (BID) | ORAL | Status: DC

## 2013-12-06 MED ORDER — RHO D IMMUNE GLOBULIN 1500 UNIT/2ML IJ SOLN
300.0000 ug | Freq: Once | INTRAMUSCULAR | Status: AC
  Administered 2013-12-06: 300 ug via INTRAMUSCULAR
  Filled 2013-12-06: qty 2

## 2013-12-06 NOTE — Progress Notes (Signed)
Written and verbal d/c instructions given and understanding voiced. 

## 2013-12-06 NOTE — Patient Instructions (Signed)
Rh0 [D] Immune Globulin injection What is this medicine? RhO [D] IMMUNE GLOBULIN (i MYOON GLOB yoo lin) is used to treat idiopathic thrombocytopenic purpura (ITP). This medicine is used in RhO negative mothers who are pregnant with a RhO positive child. It is also used after a transfusion of RhO positive blood into a RhO negative person. This medicine may be used for other purposes; ask your health care provider or pharmacist if you have questions. COMMON BRAND NAME(S): BayRho-D, HyperRHO S/D, MICRhoGAM, RhoGAM, Rhophylac, WinRho SDF What should I tell my health care provider before I take this medicine? They need to know if you have any of these conditions: -bleeding disorders -low levels of immunoglobulin A in the body -no spleen -an unusual or allergic reaction to human immune globulin, other medicines, foods, dyes, or preservatives -pregnant or trying to get pregnant -breast-feeding How should I use this medicine? This medicine is for injection into a muscle or into a vein. It is given by a health care professional in a hospital or clinic setting. Talk to your pediatrician regarding the use of this medicine in children. This medicine is not approved for use in children. Overdosage: If you think you have taken too much of this medicine contact a poison control center or emergency room at once. NOTE: This medicine is only for you. Do not share this medicine with others. What if I miss a dose? It is important not to miss your dose. Call your doctor or health care professional if you are unable to keep an appointment. What may interact with this medicine? -live virus vaccines, like measles, mumps, or rubella This list may not describe all possible interactions. Give your health care provider a list of all the medicines, herbs, non-prescription drugs, or dietary supplements you use. Also tell them if you smoke, drink alcohol, or use illegal drugs. Some items may interact with your  medicine. What should I watch for while using this medicine? This medicine is made from human blood. It may be possible to pass an infection in this medicine. Talk to your doctor about the risks and benefits of this medicine. This medicine may interfere with live virus vaccines. Before you get live virus vaccines tell your health care professional if you have received this medicine within the past 3 months. What side effects may I notice from receiving this medicine? Side effects that you should report to your doctor or health care professional as soon as possible: -allergic reactions like skin rash, itching or hives, swelling of the face, lips, or tongue -breathing problems -chest pain or tightness -yellowing of the eyes or skin Side effects that usually do not require medical attention (report to your doctor or health care professional if they continue or are bothersome): -fever -pain and tenderness at site where injected This list may not describe all possible side effects. Call your doctor for medical advice about side effects. You may report side effects to FDA at 1-800-FDA-1088. Where should I keep my medicine? This drug is given in a hospital or clinic and will not be stored at home. NOTE: This sheet is a summary. It may not cover all possible information. If you have questions about this medicine, talk to your doctor, pharmacist, or health care provider.  2014, [employer]vier/Gold Standard. (2008-05-15 14:06:10)

## 2013-12-06 NOTE — Discharge Instructions (Signed)
Hypertension During Pregnancy Hypertension is also called high blood pressure. It can occur at any time in life and during pregnancy. When you have hypertension, there is extra pressure inside your blood vessels that carry blood from the heart to the rest of your body (arteries). Hypertension during pregnancy can cause problems for you and your baby. Your baby might not weigh as much as it should at birth or might be born early (premature). Very bad cases of hypertension during pregnancy can be life threatening.  Different types of hypertension can occur during pregnancy.   Chronic hypertension. This happens when a woman has hypertension before pregnancy and it continues during pregnancy.  Gestational hypertension. This is when hypertension develops during pregnancy.  Preeclampsia or toxemia of pregnancy. This is a very serious type of hypertension that develops only during pregnancy. It is a disease that affects the whole body (systemic) and can be very dangerous for both mother and baby.  Gestational hypertension and preeclampsia usually go away after your baby is born. Blood pressure generally stabilizes within 6 weeks. Women who have hypertension during pregnancy have a greater chance of developing hypertension later in life or with future pregnancies. RISK FACTORS Some factors make you more likely to develop hypertension during pregnancy. Risk factors include:  Having hypertension before pregnancy.  Having hypertension during a previous pregnancy.  Being overweight.  Being older than 57.  Being pregnant with more than one baby (multiples).  Having diabetes or kidney problems. SIGNS AND SYMPTOMS Chronic and gestational hypertension rarely cause symptoms. Preeclampsia has symptoms, which may include:  Increased protein in your urine. Your health care provider will check for this at every prenatal visit.  Swelling of your hands and face.  Rapid weight gain.  Headaches.  Visual  changes.  Being bothered by light.  Abdominal pain, especially in the right upper area.  Chest pain.  Shortness of breath.  Increased reflexes.  Seizures. Seizures occur with a more severe form of preeclampsia, called eclampsia. DIAGNOSIS   You may be diagnosed with hypertension during a regular prenatal exam. At each visit, tests may include:  Blood pressure checks.  A urine test to check for protein in your urine.  The type of hypertension you are diagnosed with depends on when you developed it. It also depends on your specific blood pressure reading.  Developing hypertension before 20 weeks of pregnancy is consistent with chronic hypertension.  Developing hypertension after 20 weeks of pregnancy is consistent with gestational hypertension.  Hypertension with increased urinary protein is diagnosed as preeclampsia.  Blood pressure measurements that stay above 696 systolic or 789 diastolic are a sign of severe preeclampsia. TREATMENT Treatment for hypertension during pregnancy varies. Treatment depends on the type of hypertension and how serious it is.  If you take medicine for chronic hypertension, you may need to switch medicines.  Drugs called ACE inhibitors should not be taken during pregnancy.  Low-dose aspirin may be suggested for women who have risk factors for preeclampsia.  If you have gestational hypertension, you may need to take a blood pressure medicine that is safe during pregnancy. Your health care provider will recommend the appropriate medicine.  If you have severe preeclampsia, you may need to be in the hospital. Health care providers will watch you and the baby very closely. You also may need to take medicine (magnesium sulfate) to prevent seizures and lower blood pressure.  Sometimes an early delivery is needed. This may be the case if the condition worsens. It would  be done to protect you and the baby. The only cure for preeclampsia is delivery. HOME  CARE INSTRUCTIONS  Schedule and keep all of your regular appointments for prenatal care.  Only take over-the-counter or prescription medicines as directed by your health care provider. Tell your health care provider about all medicines you take.  Eat as little salt as possible.  Get regular exercise.  Do not drink alcohol.  Do not use tobacco products.  Do not drink products with caffeine.  Lie on your left side when resting. SEEK IMMEDIATE MEDICAL CARE IF:  You have severe abdominal pain.  You have sudden swelling in the hands, ankles, or face.  You gain 4 pounds (1.8 kg) or more in 1 week.  You vomit repeatedly.  You have vaginal bleeding.  You do not feel the baby moving as much.  You have a headache.  You have blurred or double vision.  You have muscle twitching or spasms.  You have shortness of breath.  You have blue fingernails and lips.  You have blood in your urine. MAKE SURE YOU:  Understand these instructions.  Will watch your condition.  Will get help right away if you are not doing well or get worse. Document Released: 06/03/2011 Document Revised: 07/06/2013 Document Reviewed: 04/14/2013 [facility] Patient Information 2014 [place], [place].  Hypertension During Pregnancy Hypertension is also called high blood pressure. It can occur at any time in life and during pregnancy. When you have hypertension, there is extra pressure inside your blood vessels that carry blood from the heart to the rest of your body (arteries). Hypertension during pregnancy can cause problems for you and your baby. Your baby might not weigh as much as it should at birth or might be born early (premature). Very bad cases of hypertension during pregnancy can be life threatening.  Different types of hypertension can occur during pregnancy.   Chronic hypertension. This happens when a woman has hypertension before pregnancy and it continues during pregnancy.  Gestational  hypertension. This is when hypertension develops during pregnancy.  Preeclampsia or toxemia of pregnancy. This is a very serious type of hypertension that develops only during pregnancy. It is a disease that affects the whole body (systemic) and can be very dangerous for both mother and baby.  Gestational hypertension and preeclampsia usually go away after your baby is born. Blood pressure generally stabilizes within 6 weeks. Women who have hypertension during pregnancy have a greater chance of developing hypertension later in life or with future pregnancies. RISK FACTORS Some factors make you more likely to develop hypertension during pregnancy. Risk factors include:  Having hypertension before pregnancy.  Having hypertension during a previous pregnancy.  Being overweight.  Being older than 40.  Being pregnant with more than one baby (multiples).  Having diabetes or kidney problems. SIGNS AND SYMPTOMS Chronic and gestational hypertension rarely cause symptoms. Preeclampsia has symptoms, which may include:  Increased protein in your urine. Your health care provider will check for this at every prenatal visit.  Swelling of your hands and face.  Rapid weight gain.  Headaches.  Visual changes.  Being bothered by light.  Abdominal pain, especially in the right upper area.  Chest pain.  Shortness of breath.  Increased reflexes.  Seizures. Seizures occur with a more severe form of preeclampsia, called eclampsia. DIAGNOSIS   You may be diagnosed with hypertension during a regular prenatal exam. At each visit, tests may include:  Blood pressure checks.  A urine test to check for protein  in your urine.  The type of hypertension you are diagnosed with depends on when you developed it. It also depends on your specific blood pressure reading.  Developing hypertension before 20 weeks of pregnancy is consistent with chronic hypertension.  Developing hypertension after 20  weeks of pregnancy is consistent with gestational hypertension.  Hypertension with increased urinary protein is diagnosed as preeclampsia.  Blood pressure measurements that stay above 160 systolic or 110 diastolic are a sign of severe preeclampsia. TREATMENT Treatment for hypertension during pregnancy varies. Treatment depends on the type of hypertension and how serious it is.  If you take medicine for chronic hypertension, you may need to switch medicines.  Drugs called ACE inhibitors should not be taken during pregnancy.  Low-dose aspirin may be suggested for women who have risk factors for preeclampsia.  If you have gestational hypertension, you may need to take a blood pressure medicine that is safe during pregnancy. Your health care provider will recommend the appropriate medicine.  If you have severe preeclampsia, you may need to be in the hospital. Health care providers will watch you and the baby very closely. You also may need to take medicine (magnesium sulfate) to prevent seizures and lower blood pressure.  Sometimes an early delivery is needed. This may be the case if the condition worsens. It would be done to protect you and the baby. The only cure for preeclampsia is delivery. HOME CARE INSTRUCTIONS  Schedule and keep all of your regular appointments for prenatal care.  Only take over-the-counter or prescription medicines as directed by your health care provider. Tell your health care provider about all medicines you take.  Eat as little salt as possible.  Get regular exercise.  Do not drink alcohol.  Do not use tobacco products.  Do not drink products with caffeine.  Lie on your left side when resting. SEEK IMMEDIATE MEDICAL CARE IF:  You have severe abdominal pain.  You have sudden swelling in the hands, ankles, or face.  You gain 4 pounds (1.8 kg) or more in 1 week.  You vomit repeatedly.  You have vaginal bleeding.  You do not feel the baby moving as  much.  You have a headache.  You have blurred or double vision.  You have muscle twitching or spasms.  You have shortness of breath.  You have blue fingernails and lips.  You have blood in your urine. MAKE SURE YOU:  Understand these instructions.  Will watch your condition.  Will get help right away if you are not doing well or get worse. Document Released: 06/03/2011 Document Revised: 07/06/2013 Document Reviewed: 04/14/2013 [facility] Patient Information 2014 [employer] [employer].

## 2013-12-06 NOTE — MAU Note (Signed)
Patient presents to MAU having been sent up from the clinic for evaluation of elevated blood pressure. Denies headache, vision changes, or epigastric pain at this time. Denies LOF, VB, or contractions. Reports good fetal movement.

## 2013-12-06 NOTE — Progress Notes (Signed)
P= 95 Mild occasional edema in hands and ankles.

## 2013-12-06 NOTE — Progress Notes (Signed)
Pt Rh neg- needs Rhogam.  Pharmacy out. GBS and cx done today New proteinuria will send to MAU for labs and NST and r/o superimposed preeclampsia

## 2013-12-07 LAB — RH IG WORKUP (INCLUDES ABO/RH)
ABO/RH(D): O NEG
Antibody Screen: NEGATIVE
Fetal Screen: NEGATIVE
GESTATIONAL AGE(WKS): 36
UNIT DIVISION: 0

## 2013-12-07 LAB — GC/CHLAMYDIA PROBE AMP
CT PROBE, AMP APTIMA: NEGATIVE
GC Probe RNA: NEGATIVE

## 2013-12-08 ENCOUNTER — Ambulatory Visit (INDEPENDENT_AMBULATORY_CARE_PROVIDER_SITE_OTHER): Payer: Medicaid Other | Admitting: *Deleted

## 2013-12-08 VITALS — BP 146/83

## 2013-12-08 DIAGNOSIS — IMO0001 Reserved for inherently not codable concepts without codable children: Secondary | ICD-10-CM

## 2013-12-08 DIAGNOSIS — O169 Unspecified maternal hypertension, unspecified trimester: Secondary | ICD-10-CM

## 2013-12-08 LAB — US OB FOLLOW UP

## 2013-12-08 NOTE — Progress Notes (Signed)
NST performed today was reviewed and was found to be reactive.  AFI normal at 9.5 cm.  Continue recommended antenatal testing and prenatal care.

## 2013-12-08 NOTE — Progress Notes (Signed)
P = 92 

## 2013-12-12 LAB — CULTURE, BETA STREP (GROUP B ONLY)

## 2013-12-13 ENCOUNTER — Inpatient Hospital Stay (HOSPITAL_COMMUNITY)
Admission: AD | Admit: 2013-12-13 | Discharge: 2013-12-17 | DRG: 774 | Disposition: A | Discharge status: Home / Self Care | Payer: Medicaid Other | Source: Ambulatory Visit | Attending: Family Medicine | Admitting: Family Medicine

## 2013-12-13 ENCOUNTER — Ambulatory Visit (INDEPENDENT_AMBULATORY_CARE_PROVIDER_SITE_OTHER): Payer: Medicaid Other | Admitting: Family

## 2013-12-13 ENCOUNTER — Encounter (HOSPITAL_COMMUNITY): Payer: Self-pay | Admitting: General Practice

## 2013-12-13 VITALS — BP 158/87 | Temp 98.0°F | Wt 348.2 lb

## 2013-12-13 DIAGNOSIS — O1490 Unspecified pre-eclampsia, unspecified trimester: Secondary | ICD-10-CM | POA: Diagnosis present

## 2013-12-13 DIAGNOSIS — IMO0001 Reserved for inherently not codable concepts without codable children: Secondary | ICD-10-CM

## 2013-12-13 DIAGNOSIS — B3731 Acute candidiasis of vulva and vagina: Secondary | ICD-10-CM

## 2013-12-13 DIAGNOSIS — Z6791 Unspecified blood type, Rh negative: Secondary | ICD-10-CM

## 2013-12-13 DIAGNOSIS — B373 Candidiasis of vulva and vagina: Secondary | ICD-10-CM

## 2013-12-13 DIAGNOSIS — Z87891 Personal history of nicotine dependence: Secondary | ICD-10-CM

## 2013-12-13 DIAGNOSIS — N390 Urinary tract infection, site not specified: Secondary | ICD-10-CM | POA: Diagnosis present

## 2013-12-13 DIAGNOSIS — O169 Unspecified maternal hypertension, unspecified trimester: Secondary | ICD-10-CM

## 2013-12-13 DIAGNOSIS — Z3400 Encounter for supervision of normal first pregnancy, unspecified trimester: Secondary | ICD-10-CM

## 2013-12-13 DIAGNOSIS — O99214 Obesity complicating childbirth: Secondary | ICD-10-CM

## 2013-12-13 DIAGNOSIS — O99212 Obesity complicating pregnancy, second trimester: Secondary | ICD-10-CM

## 2013-12-13 DIAGNOSIS — O26893 Other specified pregnancy related conditions, third trimester: Secondary | ICD-10-CM

## 2013-12-13 DIAGNOSIS — E669 Obesity, unspecified: Secondary | ICD-10-CM | POA: Diagnosis present

## 2013-12-13 DIAGNOSIS — O2390 Unspecified genitourinary tract infection in pregnancy, unspecified trimester: Secondary | ICD-10-CM | POA: Diagnosis present

## 2013-12-13 DIAGNOSIS — IMO0002 Reserved for concepts with insufficient information to code with codable children: Principal | ICD-10-CM | POA: Diagnosis present

## 2013-12-13 DIAGNOSIS — I10 Essential (primary) hypertension: Secondary | ICD-10-CM | POA: Diagnosis present

## 2013-12-13 DIAGNOSIS — O360990 Maternal care for other rhesus isoimmunization, unspecified trimester, not applicable or unspecified: Secondary | ICD-10-CM | POA: Diagnosis present

## 2013-12-13 HISTORY — DX: Obesity, unspecified: E66.9

## 2013-12-13 LAB — CBC
HCT: 35.1 % — ABNORMAL LOW (ref 36.0–46.0)
Hemoglobin: 11.6 g/dL — ABNORMAL LOW (ref 12.0–15.0)
MCH: 28 pg (ref 26.0–34.0)
MCHC: 33 g/dL (ref 30.0–36.0)
MCV: 84.8 fL (ref 78.0–100.0)
Platelets: 194 10*3/uL (ref 150–400)
RBC: 4.14 MIL/uL (ref 3.87–5.11)
RDW: 14.8 % (ref 11.5–15.5)
WBC: 10.4 10*3/uL (ref 4.0–10.5)

## 2013-12-13 LAB — COMPREHENSIVE METABOLIC PANEL
ALT: 22 U/L (ref 0–35)
AST: 23 U/L (ref 0–37)
Albumin: 2.4 g/dL — ABNORMAL LOW (ref 3.5–5.2)
Alkaline Phosphatase: 120 U/L — ABNORMAL HIGH (ref 39–117)
BUN: 10 mg/dL (ref 6–23)
CO2: 22 mEq/L (ref 19–32)
CREATININE: 0.68 mg/dL (ref 0.50–1.10)
Calcium: 9.6 mg/dL (ref 8.4–10.5)
Chloride: 105 mEq/L (ref 96–112)
GFR calc non Af Amer: >90 mL/min (ref >90)
GLUCOSE: 108 mg/dL — AB (ref 70–99)
POTASSIUM: 4 meq/L (ref 3.7–5.3)
Sodium: 140 mEq/L (ref 137–147)
TOTAL PROTEIN: 6 g/dL (ref 6.0–8.3)
Total Bilirubin: 0.2 mg/dL — ABNORMAL LOW (ref 0.3–1.2)

## 2013-12-13 LAB — POCT URINALYSIS DIP (DEVICE)
BILIRUBIN URINE: NEGATIVE
Glucose, UA: NEGATIVE mg/dL
Ketones, ur: NEGATIVE mg/dL
LEUKOCYTES UA: NEGATIVE
NITRITE: POSITIVE — AB
Protein, ur: >=300 mg/dL — AB
Specific Gravity, Urine: >=1.03 (ref 1.005–1.030)
Urobilinogen, UA: 0.2 mg/dL (ref 0.0–1.0)
pH: 6 (ref 5.0–8.0)

## 2013-12-13 LAB — PROTEIN / CREATININE RATIO, URINE
Creatinine, Urine: 355.28 mg/dL
PROTEIN CREATININE RATIO: 0.47 — AB (ref 0.00–0.15)
Total Protein, Urine: 167 mg/dL

## 2013-12-13 LAB — RPR: RPR: NONREACTIVE

## 2013-12-13 MED ORDER — LABETALOL HCL 5 MG/ML IV SOLN
20.0000 mg | Freq: Once | INTRAVENOUS | Status: AC
  Administered 2013-12-13: 20 mg via INTRAVENOUS
  Filled 2013-12-13: qty 4

## 2013-12-13 MED ORDER — CEPHALEXIN 500 MG PO CAPS
500.0000 mg | ORAL_CAPSULE | Freq: Three times a day (TID) | ORAL | Status: DC
Start: 2013-12-13 — End: 2013-12-15
  Administered 2013-12-13 – 2013-12-15 (×6): 500 mg via ORAL
  Filled 2013-12-13 (×7): qty 1

## 2013-12-13 MED ORDER — ONDANSETRON HCL 4 MG/2ML IJ SOLN
4.0000 mg | Freq: Four times a day (QID) | INTRAMUSCULAR | Status: DC | PRN
  Administered 2013-12-14: 4 mg via INTRAVENOUS
  Filled 2013-12-13: qty 2

## 2013-12-13 MED ORDER — CITRIC ACID-SODIUM CITRATE 334-500 MG/5ML PO SOLN: 30.0000 mL | ORAL | Status: DC | PRN

## 2013-12-13 MED ORDER — MAGNESIUM SULFATE 40 G IN LACTATED RINGERS - SIMPLE
2.0000 g/h | INTRAVENOUS | Status: AC
  Administered 2013-12-14 – 2013-12-16 (×2): 2 g/h via INTRAVENOUS
  Filled 2013-12-13 (×4): qty 500

## 2013-12-13 MED ORDER — LABETALOL HCL 300 MG PO TABS
300.0000 mg | ORAL_TABLET | Freq: Three times a day (TID) | ORAL | Status: DC
  Administered 2013-12-13 – 2013-12-16 (×9): 300 mg via ORAL
  Filled 2013-12-13 (×10): qty 1

## 2013-12-13 MED ORDER — OXYCODONE-ACETAMINOPHEN 5-325 MG PO TABS: 1.0000 | ORAL_TABLET | ORAL | Status: DC | PRN

## 2013-12-13 MED ORDER — MAGNESIUM SULFATE BOLUS VIA INFUSION
4.0000 g | Freq: Once | INTRAVENOUS | Status: AC
  Administered 2013-12-13: 4 g via INTRAVENOUS
  Filled 2013-12-13: qty 500

## 2013-12-13 MED ORDER — OXYTOCIN 40 UNITS IN LACTATED RINGERS INFUSION - SIMPLE MED
1.0000 m[IU]/min | INTRAVENOUS | Status: DC
  Administered 2013-12-13: 1 m[IU]/min via INTRAVENOUS
  Filled 2013-12-13: qty 1000

## 2013-12-13 MED ORDER — ACETAMINOPHEN 325 MG PO TABS: 650.0000 mg | ORAL_TABLET | ORAL | Status: DC | PRN

## 2013-12-13 MED ORDER — LABETALOL HCL 100 MG PO TABS: 300.0000 mg | ORAL_TABLET | Freq: Three times a day (TID) | ORAL | Status: DC

## 2013-12-13 MED ORDER — TERBUTALINE SULFATE 1 MG/ML IJ SOLN: 0.2500 mg | Freq: Once | INTRAMUSCULAR | Status: AC | PRN

## 2013-12-13 MED ORDER — OXYTOCIN 40 UNITS IN LACTATED RINGERS INFUSION - SIMPLE MED: 62.5000 mL/h | INTRAVENOUS | Status: DC

## 2013-12-13 MED ORDER — OXYTOCIN BOLUS FROM INFUSION
500.0000 mL | INTRAVENOUS | Status: DC
Start: 2013-12-13 — End: 2013-12-15

## 2013-12-13 MED ORDER — LACTATED RINGERS IV SOLN: 500.0000 mL | INTRAVENOUS | Status: DC | PRN

## 2013-12-13 MED ORDER — HYDRALAZINE HCL 20 MG/ML IJ SOLN
10.0000 mg | Freq: Once | INTRAMUSCULAR | Status: AC
  Administered 2013-12-13: 10 mg via INTRAVENOUS
  Filled 2013-12-13: qty 1

## 2013-12-13 MED ORDER — IBUPROFEN 600 MG PO TABS: 600.0000 mg | ORAL_TABLET | Freq: Four times a day (QID) | ORAL | Status: DC | PRN

## 2013-12-13 MED ORDER — LACTATED RINGERS IV SOLN
INTRAVENOUS | Status: DC
  Administered 2013-12-13 – 2013-12-15 (×5): via INTRAVENOUS

## 2013-12-13 MED ORDER — LIDOCAINE HCL (PF) 1 % IJ SOLN
30.0000 mL | INTRAMUSCULAR | Status: DC | PRN
  Filled 2013-12-13: qty 30

## 2013-12-13 NOTE — Progress Notes (Signed)
   Subjective: Pt reports comfortable.  Denies headache, vision changes, or epigastric pain.    Objective: BP 140/85  Pulse 79  Temp(Src) 98.1 F (36.7 C) (Oral)  Resp 20  Ht 5\' 8"  (1.727 m)  Wt 154.223 kg (340 lb)  BMI 51.71 kg/m2  LMP 03/26/2013      FHT:  FHR: 120's bpm, variability: moderate,  accelerations:  Present,  decelerations:  Absent UC:   irregular, every 8-12 minutes SVE:   Dilation: 1.5 Effacement (%): 40 Station: -3 Exam by:: Roney MarionW. Muhammad, CNM  Labs: Lab Results  Component Value Date   WBC 10.4 12/13/2013   HGB 11.6* 12/13/2013   HCT 35.1* 12/13/2013   MCV 84.8 12/13/2013   PLT 194 12/13/2013    Assessment / Plan: 23 yo G1P0 at 583w3d wks IUP Induction of labor due to preeclampsia  Labor: Progressing normally Preeclampsia:  on magnesium sulfate and no signs or symptoms of toxicity Fetal Wellbeing:  Category I Pain Control:  Labor support without medications I/D:  GBS neg Anticipated MOD:  NSVD Foley bulb placed without difficulty.   [facility]UHAMMAD,WALIDAH 12/13/2013, 3:58 PM

## 2013-12-13 NOTE — MAU Note (Signed)
Patient was seen in the clinic and sent to MAU for evaluation of elevated blood pressure. Patient had a reactive NST at the clinic.

## 2013-12-13 NOTE — H&P (Signed)
Kayla Bautista is a 23 y.o. female presenting for induction of labor due to chronic hypertension with superimposed preeclampsia.  Pt has been seen in High Risk clinic since 5 wks IUP.  Pregnancy is dated by 8 wks ultrasound.  Pregnancy complicated with chronic hypertension, morbid obesity, and now superimposed preeclampsia.  Blood pressure has been treated with labetalol 300 mg TID.  During pregnancy TSH was low with a normal free T3 and free T4.  Seen in clinic today and blood pressure was in the high 150's/80-100 range.  Pt denies headache, epigastric pain or visual changes.    Maternal Medical History:  Fetal activity: Perceived fetal activity is normal.    Prenatal complications: PIH and pre-eclampsia.     OB History   Grav Para Term Preterm Abortions TAB SAB Ect Mult Living   1              Past Medical History  Diagnosis Date  . Stye   . Infection     UTI  . Hypertension    Past Surgical History  Procedure Laterality Date  . No past surgeries     Family History: family history includes Diabetes in her maternal grandfather, maternal grandmother, and mother. Social History:  reports that she quit smoking about 7 months ago. Her smoking use included Cigarettes. She has a .5 pack-year smoking history. She has never used smokeless tobacco. She reports that she does not drink alcohol or use illicit drugs.   Review of Systems  Eyes: Negative for blurred vision and double vision.  Gastrointestinal: Negative for abdominal pain.  Neurological: Negative for headaches.  All other systems reviewed and are negative.    Dilation: 1.5 Effacement (%): 40 Station: -3 Exam by:: Roney MarionW. Muhammad, CNM Blood pressure 144/81, pulse 84, resp. rate 18, last menstrual period 03/26/2013. Maternal Exam:  Abdomen: Estimated fetal weight is Difficult secondary to weight.   Fetal presentation: vertex  Introitus: Vagina is positive for vaginal discharge (mucusy).    Fetal Exam Fetal Monitor  Review: Baseline rate: 130's.  Variability: moderate (6-25 bpm).   Pattern: accelerations present.    Fetal State Assessment: Category I - tracings are normal.    Toco - no contractions on monitor Physical Exam  Constitutional: She is oriented to person, place, and time. She appears well-developed and well-nourished. No distress.  HENT:  Head: Normocephalic.  Eyes: Pupils are equal, round, and reactive to light.  Neck: Normal range of motion. Neck supple.  Cardiovascular: Normal rate, regular rhythm and normal heart sounds.   Respiratory: Effort normal and breath sounds normal. No respiratory distress.  GI: Soft. There is no tenderness.  Genitourinary: No bleeding around the vagina. Vaginal discharge (mucusy) found.  Musculoskeletal: Normal range of motion. She exhibits edema (trace bilat).  Trace pedal edema  Neurological: She is alert and oriented to person, place, and time. She has normal reflexes. She displays normal reflexes.  Skin: Skin is warm and dry.    Prenatal labs: ABO, Rh: --/--/O NEG, O NEG (03/10 1324) Antibody: NEG (03/10 1324) Rubella: 22.30 (08/27 1127) RPR: NON REAC (01/06 1311)  HBsAg: NEGATIVE (08/27 1127)  HIV: NON REACTIVE (01/06 1311)  GBS:   Negative  Assessment/Plan: 23 yo G1P0 at 6268w3d wks IUP Morbid Obesity Chronic Hypertension w/superimposed Preeclampsia GBS negative RH negative UTI  Plan: Admit to Birthing Suites  Keflex 500 mg TID; send urine culture prior to treatment Place Foley Bulb Begin Magnesium Sulfate Anticipate NSVD  [facility]AMMAD,Donisha Hoch 12/13/2013, 2:06 PM

## 2013-12-13 NOTE — Progress Notes (Signed)
Pt denies headache, epigastric pain or vision changes.  NST-reactive.  Denies contractions or vaginal bleeding.  Sent to MAU for PreX evaluation.

## 2013-12-13 NOTE — Progress Notes (Signed)
Pulse- 86 Patient reports left hip pain; denies HA's or blurry vision  US for growth on 3/20

## 2013-12-13 NOTE — Progress Notes (Signed)
Roque CashMichelle Kushnir is a 23 y.o. G1P0 at 5357w3d by ultrasound admitted for induction of labor due to Hypertension and with superimposed severe PreX.  Subjective: Headache resolved. Otherwise without complaint.   Objective: BP 155/79  Pulse 87  Temp(Src) 98.4 F (36.9 C) (Oral)  Resp 22  Ht 5\' 8"  (1.727 m)  Wt 154.223 kg (340 lb)  BMI 51.71 kg/m2  LMP 03/26/2013 I/O last 3 completed shifts: In: 1377.5 [P.O.:840; I.V.:537.5] Out: 250 [Urine:250] Total I/O In: 725 [P.O.:600; I.V.:125] Out: 300 [Urine:300]  FHT:  FHR: 130 bpm, variability: moderate,  accelerations:  Present,  decelerations:  Present Category I UC:   none SVE; FB in place, dilation at 2cm around bulb Labs: Lab Results  Component Value Date   WBC 10.4 12/13/2013   HGB 11.6* 12/13/2013   HCT 35.1* 12/13/2013   MCV 84.8 12/13/2013   PLT 194 12/13/2013    Assessment / Plan: IOL due to gHTN and superimposed severe pre-Eclampsia 5 h s/p foley bulb placement  Labor: Slow cervical changes, initiate low dose pitocin. Cervical check around FB 2cm dilation Preeclampsia:  on magnesium sulfate and mild headache has resolved; otherwise asympotmatic; Consider increasing labetelol; earlier meaurements may have been inaccurate due to too small cuff size   Fetal Wellbeing:  Category I and reassuring Pain Control:  Labor support without medications I/D:  GBS neg Anticipated MOD:  NSVD  Michaelene SongHall, Ciarah Peace C 12/13/2013, 9:00 PM

## 2013-12-13 NOTE — H&P (Signed)
Attestation of Attending Supervision of Advanced Practitioner (PA/CNM/NP): Evaluation and management procedures were performed by the Advanced Practitioner under my supervision and collaboration.  I have reviewed the Advanced Practitioner's note and chart, and I agree with the management and plan.  Reva BoresPRATT,Emmit Oriley S, MD [facility] Attending 12/13/2013 4:26 PM

## 2013-12-14 ENCOUNTER — Encounter (HOSPITAL_COMMUNITY): Payer: Self-pay

## 2013-12-14 ENCOUNTER — Encounter: Payer: Self-pay | Admitting: *Deleted

## 2013-12-14 ENCOUNTER — Inpatient Hospital Stay (HOSPITAL_COMMUNITY): Payer: Medicaid Other | Admitting: Anesthesiology

## 2013-12-14 ENCOUNTER — Encounter (HOSPITAL_COMMUNITY): Payer: Medicaid Other | Admitting: Anesthesiology

## 2013-12-14 LAB — T3, FREE: T3, Free: 2.9 pg/mL (ref 2.3–4.2)

## 2013-12-14 LAB — T4, FREE: Free T4: 0.88 ng/dL (ref 0.80–1.80)

## 2013-12-14 LAB — CBC
HEMATOCRIT: 35.7 % — AB (ref 36.0–46.0)
Hemoglobin: 11.7 g/dL — ABNORMAL LOW (ref 12.0–15.0)
MCH: 27.8 pg (ref 26.0–34.0)
MCHC: 32.8 g/dL (ref 30.0–36.0)
MCV: 84.8 fL (ref 78.0–100.0)
PLATELETS: 210 10*3/uL (ref 150–400)
RBC: 4.21 MIL/uL (ref 3.87–5.11)
RDW: 15 % (ref 11.5–15.5)
WBC: 10.9 10*3/uL — AB (ref 4.0–10.5)

## 2013-12-14 LAB — TSH: TSH: 0.493 u[IU]/mL (ref 0.350–4.500)

## 2013-12-14 MED ORDER — FENTANYL 2.5 MCG/ML BUPIVACAINE 1/10 % EPIDURAL INFUSION (WH - ANES)
14.0000 mL/h | INTRAMUSCULAR | Status: DC | PRN
  Administered 2013-12-15 (×2): 14 mL/h via EPIDURAL
  Filled 2013-12-14 (×2): qty 125

## 2013-12-14 MED ORDER — PHENYLEPHRINE 40 MCG/ML (10ML) SYRINGE FOR IV PUSH (FOR BLOOD PRESSURE SUPPORT)
80.0000 ug | PREFILLED_SYRINGE | INTRAVENOUS | Status: DC | PRN
  Filled 2013-12-14: qty 2

## 2013-12-14 MED ORDER — PHENYLEPHRINE 40 MCG/ML (10ML) SYRINGE FOR IV PUSH (FOR BLOOD PRESSURE SUPPORT)
PREFILLED_SYRINGE | INTRAVENOUS | Status: AC
  Filled 2013-12-14: qty 10

## 2013-12-14 MED ORDER — OXYTOCIN 40 UNITS IN LACTATED RINGERS INFUSION - SIMPLE MED
1.0000 m[IU]/min | INTRAVENOUS | Status: DC
Start: 2013-12-14 — End: 2013-12-15
  Administered 2013-12-15: 22 m[IU]/min via INTRAVENOUS
  Filled 2013-12-14: qty 1000

## 2013-12-14 MED ORDER — EPHEDRINE 5 MG/ML INJ
INTRAVENOUS | Status: AC
  Filled 2013-12-14: qty 4

## 2013-12-14 MED ORDER — FENTANYL CITRATE 0.05 MG/ML IJ SOLN
100.0000 ug | INTRAMUSCULAR | Status: DC | PRN
  Administered 2013-12-14 (×4): 100 ug via INTRAVENOUS
  Filled 2013-12-14 (×4): qty 2

## 2013-12-14 MED ORDER — LIDOCAINE HCL (PF) 1 % IJ SOLN
INTRAMUSCULAR | Status: DC | PRN
  Administered 2013-12-14 (×2): 5 mL

## 2013-12-14 MED ORDER — FENTANYL 2.5 MCG/ML BUPIVACAINE 1/10 % EPIDURAL INFUSION (WH - ANES)
INTRAMUSCULAR | Status: AC
  Administered 2013-12-14: 14 mL/h via EPIDURAL
  Filled 2013-12-14: qty 125

## 2013-12-14 MED ORDER — DIPHENHYDRAMINE HCL 50 MG/ML IJ SOLN
12.5000 mg | INTRAMUSCULAR | Status: DC | PRN
  Administered 2013-12-15 (×2): 12.5 mg via INTRAVENOUS
  Filled 2013-12-14 (×2): qty 1

## 2013-12-14 MED ORDER — LACTATED RINGERS IV SOLN
500.0000 mL | Freq: Once | INTRAVENOUS | Status: AC
  Administered 2013-12-14: 500 mL via INTRAVENOUS

## 2013-12-14 MED ORDER — EPHEDRINE 5 MG/ML INJ
10.0000 mg | INTRAVENOUS | Status: DC | PRN
  Filled 2013-12-14: qty 2

## 2013-12-14 NOTE — Progress Notes (Signed)
I have seen this patient and agree with the above resident's note.  LEFTWICH-KIRBY, Mallie Giambra Certified Nurse-Midwife 

## 2013-12-14 NOTE — Progress Notes (Signed)
Kayla CashMichelle Bautista is a 23 y.o. G1P0 at 6622w4d admitted for induction of labor due to preeclampsia.  Subjective: Pt comfortable; No complaints.   Objective: BP 158/90  Pulse 77  Temp(Src) 97.8 F (36.6 C) (Oral)  Resp 20  Ht 5\' 8"  (1.727 m)  Wt 154.223 kg (340 lb)  BMI 51.71 kg/m2  SpO2 100%  LMP 03/26/2013 I/O last 3 completed shifts: In: 4384.3 [P.O.:2340; I.V.:2044.3] Out: 2700 [Urine:2700] Total I/O In: 1542.2 [P.O.:600; I.V.:942.2] Out: 1250 [Urine:1250]  FHT:  FHR: 120 bpm, variability: moderate,  accelerations:  Present,  decelerations:  Absent UC:   q2-94min Pitocin at  34 milliunits/min  Labs: Lab Results  Component Value Date   WBC 10.4 12/13/2013   HGB 11.6* 12/13/2013   HCT 35.1* 12/13/2013   MCV 84.8 12/13/2013   PLT 194 12/13/2013    Assessment / Plan: Induction of labor due to preeclampsia,  progressing well on pitocin  Labor: Progressing normally, FB out, cont to inc pit, placed IUPC Preeclampsia:  on magnesium sulfate and labetalol occassional severe range BP, resolved prior to administration of meds Fetal Wellbeing:  Category I Pain Control:  Fentanyl at this time I/D:  n/a Anticipated MOD:  NSVD  Kayla Bautista, Kayla Bautista 12/14/2013, 3:00 PM

## 2013-12-14 NOTE — Progress Notes (Signed)
Kayla Bautista is a 23 y.o. G1P0 at 8683w4d admitted for induction of labor due to preeclampsia.  Subjective: Pt reports intermittent cramping.  Denies need for pain medication at this time.  Family in room for support.    Objective: BP 153/106  Pulse 91  Temp(Src) 97.7 F (36.5 C) (Oral)  Resp 22  Ht 5\' 8"  (1.727 m)  Wt 154.223 kg (340 lb)  BMI 51.71 kg/m2  SpO2 100%  LMP 03/26/2013 I/O last 3 completed shifts: In: 1377.5 [P.O.:840; I.V.:537.5] Out: 250 [Urine:250] Total I/O In: 1335.4 [P.O.:960; I.V.:375.4] Out: 1000 [Urine:1000]  FHT:  FHR: 130 bpm, variability: moderate,  accelerations:  Present,  decelerations:  Absent UC:   irregular, every 2-5 minutes SVE:   Dilation: 5 Effacement (%): 50 Station: -3 Exam by:: l. leftwich-kirby, cnm Foley bulb found to be through cervix, removed during cervical exam.  Pt tolerated well.    Labs: Lab Results  Component Value Date   WBC 10.4 12/13/2013   HGB 11.6* 12/13/2013   HCT 35.1* 12/13/2013   MCV 84.8 12/13/2013   PLT 194 12/13/2013    Assessment / Plan: Induction of labor due to preeclampsia S/P Foley Bulb  Labor: Pitocin off x1 hour then restart 2/2 in 1 hour, pt may shower prior to restart Preeclampsia:  on magnesium sulfate, no signs or symptoms of toxicity and labs stable Fetal Wellbeing:  Category I Pain Control:  Labor support without medications I/D:  n/a Anticipated MOD:  NSVD  LEFTWICH-KIRBY, LISA 12/14/2013, 12:00 AM

## 2013-12-14 NOTE — Progress Notes (Signed)
Kayla Bautista is a 23 y.o. G1P0 at 8680w4d admitted for induction of labor due to preeclampsia.  Subjective: Pt comfortable, family at bedside for support.  Objective: BP 131/72  Pulse 98  Temp(Src) 97.7 F (36.5 C) (Oral)  Resp 20  Ht 5\' 8"  (1.727 m)  Wt 154.223 kg (340 lb)  BMI 51.71 kg/m2  SpO2 100%  LMP 03/26/2013 I/O last 3 completed shifts: In: 1377.5 [P.O.:840; I.V.:537.5] Out: 250 [Urine:250] Total I/O In: 1947.3 [P.O.:1320; I.V.:627.3] Out: 1000 [Urine:1000]  FHT:  FHR: 130 bpm, variability: moderate,  accelerations:  Present,  decelerations:  Absent UC:   irregular SVE:   Dilation: 5 Effacement (%): 50 Station: -3 Exam by:: l. leftwich-kirby, cnm Vertex  Labs: Lab Results  Component Value Date   WBC 10.4 12/13/2013   HGB 11.6* 12/13/2013   HCT 35.1* 12/13/2013   MCV 84.8 12/13/2013   PLT 194 12/13/2013    Assessment / Plan: Induction of labor due to preeclampsia S/P Foley bulb and low dose Pitocin, Pitocin off x1 hour  Labor: Restart Pitocin at 2millunits, increase by 2 milliunits per protocol Preeclampsia:  on magnesium sulfate Fetal Wellbeing:  Category I Pain Control:  Labor support without medications I/D:  n/a Anticipated MOD:  NSVD  LEFTWICH-KIRBY, Kayla Bautista 12/14/2013, 1:09 AM

## 2013-12-14 NOTE — Progress Notes (Signed)
Roque CashMichelle Iorio is a 23 y.o. G1P0 at 7253w4d admitted for induction of labor due to preeclampsia.  Subjective: Pt comfortable, not feeling contractions at this time.  Objective: BP 123/65  Pulse 85  Temp(Src) 97.9 F (36.6 C) (Oral)  Resp 20  Ht 5\' 8"  (1.727 m)  Wt 154.223 kg (340 lb)  BMI 51.71 kg/m2  SpO2 100%  LMP 03/26/2013 I/O last 3 completed shifts: In: 1377.5 [P.O.:840; I.V.:537.5] Out: 250 [Urine:250] Total I/O In: 2636.8 [P.O.:1380; I.V.:1256.8] Out: 2150 [Urine:2150]  FHT:  FHR: 130 bpm, variability: moderate,  accelerations:  Present,  decelerations:  Absent UC:   Occasional, toco readjusted for improved tracing while pt on her side SVE:   Deferred Pitocin at 14 milliunits/min  Labs: Lab Results  Component Value Date   WBC 10.4 12/13/2013   HGB 11.6* 12/13/2013   HCT 35.1* 12/13/2013   MCV 84.8 12/13/2013   PLT 194 12/13/2013    Assessment / Plan: Induction of labor due to preeclampsia,  progressing well on pitocin  Labor: Progressing normally Preeclampsia:  on magnesium sulfate Fetal Wellbeing:  Category I Pain Control:  Labor support without medications I/D:  n/a Anticipated MOD:  NSVD  LEFTWICH-KIRBY, Virlee Stroschein 12/14/2013, 5:15 AM

## 2013-12-14 NOTE — Anesthesia Preprocedure Evaluation (Signed)
Anesthesia Evaluation  Patient identified by MRN, date of birth, ID band Patient awake    Reviewed: Allergy & Precautions, H&P , Patient's Chart, lab work & pertinent test results  Airway Mallampati: III TM Distance: <3 FB Neck ROM: full    Dental   Pulmonary former smoker,  breath sounds clear to auscultation        Cardiovascular hypertension, Rhythm:regular Rate:Normal     Neuro/Psych    GI/Hepatic   Endo/Other  Morbid obesity  Renal/GU      Musculoskeletal   Abdominal   Peds  Hematology   Anesthesia Other Findings   Reproductive/Obstetrics (+) Pregnancy                          Anesthesia Physical Anesthesia Plan  ASA: III  Anesthesia Plan: Epidural   Post-op Pain Management:    Induction:   Airway Management Planned:   Additional Equipment:   Intra-op Plan:   Post-operative Plan:   Informed Consent: I have reviewed the patients History and Physical, chart, labs and discussed the procedure including the risks, benefits and alternatives for the proposed anesthesia with the patient or authorized representative who has indicated his/her understanding and acceptance.     Plan Discussed with:   Anesthesia Plan Comments:         Anesthesia Quick Evaluation  

## 2013-12-14 NOTE — Progress Notes (Signed)
Kayla Bautista is a 23 y.o. G1P0 at 4035w4d admitted for induction of labor due to preeclampsia.  Subjective: Pt comfortable, starting to feel ctx, FB out, inc pit  Objective: BP 137/84  Pulse 83  Temp(Src) 97.6 F (36.4 C) (Oral)  Resp 20  Ht 5\' 8"  (1.727 m)  Wt 154.223 kg (340 lb)  BMI 51.71 kg/m2  SpO2 100%  LMP 03/26/2013 I/O last 3 completed shifts: In: 4384.3 [P.O.:2340; I.V.:2044.3] Out: 2700 [Urine:2700] Total I/O In: 259 [I.V.:259] Out: 300 [Urine:300]  FHT:  FHR: 130 bpm, variability: moderate,  accelerations:  Present,  decelerations:  Absent UC:   q2-354min Pitocin at  26 milliunits/min  Labs: Lab Results  Component Value Date   WBC 10.4 12/13/2013   HGB 11.6* 12/13/2013   HCT 35.1* 12/13/2013   MCV 84.8 12/13/2013   PLT 194 12/13/2013    Assessment / Plan: Induction of labor due to preeclampsia,  progressing well on pitocin  Labor: Progressing normally, FB out, cont to inc pit, if no change in 4 hrs, place IUPC Preeclampsia:  on magnesium sulfate occassional severe range BP, resolved prior to administration of meds Fetal Wellbeing:  Category I Pain Control:  Labor support without medications at this time I/D:  n/a Anticipated MOD:  NSVD  Kayla Bautista 12/14/2013, 9:53 AM

## 2013-12-14 NOTE — Anesthesia Procedure Notes (Signed)
Epidural Patient location during procedure: OB Start time: 12/14/2013 5:01 PM  Staffing Anesthesiologist: Brayton CavesJACKSON, Ismail Graziani Performed by: anesthesiologist   Preanesthetic Checklist Completed: patient identified, site marked, surgical consent, pre-op evaluation, timeout performed, IV checked, risks and benefits discussed and monitors and equipment checked  Epidural Patient position: sitting Prep: site prepped and draped and DuraPrep Patient monitoring: continuous pulse ox and blood pressure Approach: midline Injection technique: LOR air  Needle:  Needle type: Tuohy  Needle gauge: 17 G Needle length: 9 cm and 9 Needle insertion depth: 9 cm Catheter type: closed end flexible Catheter size: 19 Gauge Catheter at skin depth: 14 cm Test dose: negative  Assessment Events: blood not aspirated, injection not painful, no injection resistance, negative IV test and no paresthesia  Additional Notes Patient identified.  Risk benefits discussed including failed block, incomplete pain control, headache, nerve damage, paralysis, blood pressure changes, nausea, vomiting, reactions to medication both toxic or allergic, and postpartum back pain.  Patient expressed understanding and wished to proceed.  All questions were answered.  Sterile technique used throughout procedure and epidural site dressed with sterile barrier dressing. No paresthesia or other complications noted.The patient did not experience any signs of intravascular injection such as tinnitus or metallic taste in mouth nor signs of intrathecal spread such as rapid motor block. Please see nursing notes for vital signs.

## 2013-12-15 ENCOUNTER — Encounter (HOSPITAL_COMMUNITY): Payer: Self-pay | Admitting: *Deleted

## 2013-12-15 DIAGNOSIS — N390 Urinary tract infection, site not specified: Secondary | ICD-10-CM

## 2013-12-15 DIAGNOSIS — IMO0002 Reserved for concepts with insufficient information to code with codable children: Secondary | ICD-10-CM

## 2013-12-15 DIAGNOSIS — I10 Essential (primary) hypertension: Secondary | ICD-10-CM

## 2013-12-15 DIAGNOSIS — O2390 Unspecified genitourinary tract infection in pregnancy, unspecified trimester: Secondary | ICD-10-CM

## 2013-12-15 LAB — CBC
HEMATOCRIT: 35.2 % — AB (ref 36.0–46.0)
Hemoglobin: 11.5 g/dL — ABNORMAL LOW (ref 12.0–15.0)
MCH: 27.8 pg (ref 26.0–34.0)
MCHC: 32.7 g/dL (ref 30.0–36.0)
MCV: 85.2 fL (ref 78.0–100.0)
Platelets: 217 10*3/uL (ref 150–400)
RBC: 4.13 MIL/uL (ref 3.87–5.11)
RDW: 15.1 % (ref 11.5–15.5)
WBC: 10.8 10*3/uL — AB (ref 4.0–10.5)

## 2013-12-15 LAB — CULTURE, OB URINE: Colony Count: >=100000

## 2013-12-15 LAB — MRSA PCR SCREENING: MRSA by PCR: NEGATIVE

## 2013-12-15 MED ORDER — DIPHENHYDRAMINE HCL 25 MG PO CAPS: 25.0000 mg | ORAL_CAPSULE | Freq: Four times a day (QID) | ORAL | Status: DC | PRN

## 2013-12-15 MED ORDER — ONDANSETRON HCL 4 MG PO TABS: 4.0000 mg | ORAL_TABLET | ORAL | Status: DC | PRN

## 2013-12-15 MED ORDER — LACTATED RINGERS IV SOLN
INTRAVENOUS | Status: AC
  Administered 2013-12-15 – 2013-12-16 (×3): via INTRAVENOUS

## 2013-12-15 MED ORDER — BENZOCAINE-MENTHOL 20-0.5 % EX AERO
1.0000 "application " | INHALATION_SPRAY | CUTANEOUS | Status: DC | PRN
  Administered 2013-12-16: 1 via TOPICAL
  Filled 2013-12-15: qty 56

## 2013-12-15 MED ORDER — SENNOSIDES-DOCUSATE SODIUM 8.6-50 MG PO TABS
2.0000 | ORAL_TABLET | ORAL | Status: DC
  Administered 2013-12-15: 2 via ORAL
  Filled 2013-12-15 (×2): qty 2

## 2013-12-15 MED ORDER — CITRIC ACID-SODIUM CITRATE 334-500 MG/5ML PO SOLN
ORAL | Status: AC
  Filled 2013-12-15: qty 15

## 2013-12-15 MED ORDER — OXYCODONE-ACETAMINOPHEN 5-325 MG PO TABS
1.0000 | ORAL_TABLET | ORAL | Status: DC | PRN
  Administered 2013-12-16: 1 via ORAL
  Administered 2013-12-16: 2 via ORAL
  Administered 2013-12-17 (×2): 1 via ORAL
  Filled 2013-12-15: qty 2
  Filled 2013-12-15: qty 1
  Filled 2013-12-15: qty 2
  Filled 2013-12-15: qty 1

## 2013-12-15 MED ORDER — TETANUS-DIPHTH-ACELL PERTUSSIS 5-2.5-18.5 LF-MCG/0.5 IM SUSP
0.5000 mL | Freq: Once | INTRAMUSCULAR | Status: DC
  Filled 2013-12-15: qty 0.5

## 2013-12-15 MED ORDER — SIMETHICONE 80 MG PO CHEW: 80.0000 mg | CHEWABLE_TABLET | ORAL | Status: DC | PRN

## 2013-12-15 MED ORDER — PRENATAL MULTIVITAMIN CH
1.0000 | ORAL_TABLET | Freq: Every day | ORAL | Status: DC
  Administered 2013-12-16 – 2013-12-17 (×2): 1 via ORAL
  Filled 2013-12-15 (×2): qty 1

## 2013-12-15 MED ORDER — DIBUCAINE 1 % RE OINT: 1.0000 "application " | TOPICAL_OINTMENT | RECTAL | Status: DC | PRN

## 2013-12-15 MED ORDER — LANOLIN HYDROUS EX OINT: TOPICAL_OINTMENT | CUTANEOUS | Status: DC | PRN

## 2013-12-15 MED ORDER — ONDANSETRON HCL 4 MG/2ML IJ SOLN: 4.0000 mg | INTRAMUSCULAR | Status: DC | PRN

## 2013-12-15 MED ORDER — WITCH HAZEL-GLYCERIN EX PADS: 1.0000 "application " | MEDICATED_PAD | CUTANEOUS | Status: DC | PRN

## 2013-12-15 MED ORDER — IBUPROFEN 600 MG PO TABS
600.0000 mg | ORAL_TABLET | Freq: Four times a day (QID) | ORAL | Status: DC
  Administered 2013-12-15 – 2013-12-17 (×8): 600 mg via ORAL
  Filled 2013-12-15 (×8): qty 1

## 2013-12-15 MED ORDER — ZOLPIDEM TARTRATE 5 MG PO TABS: 5.0000 mg | ORAL_TABLET | Freq: Every evening | ORAL | Status: DC | PRN

## 2013-12-15 NOTE — Progress Notes (Signed)
Kayla CashMichelle Bautista is a 23 y.o. G1P0 at 3555w5d by ultrasound admitted for induction of labor due to preeclampsia.  Subjective: Doing well; No complaints  Objective: BP 116/95  Pulse 97  Temp(Src) 98.5 F (36.9 C) (Oral)  Resp 18  Ht 5\' 8"  (1.727 m)  Wt 154.223 kg (340 lb)  BMI 51.71 kg/m2  SpO2 100%  LMP 03/26/2013 I/O last 3 completed shifts: In: 8120.6 [P.O.:3060; I.V.:5060.6] Out: 5200 [Urine:5200] Total I/O In: 50 [I.V.:50] Out: 200 [Urine:200]  FHT:  FHR: 120 bpm, variability: moderate,  accelerations:  Abscent,  decelerations:  Absent UC:   regular, every 4 minutes SVE:   Dilation: 6 Effacement (%): 80 Station: -1 Exam by:: e. poore, rn  Labs: Lab Results  Component Value Date   WBC 10.9* 12/14/2013   HGB 11.7* 12/14/2013   HCT 35.7* 12/14/2013   MCV 84.8 12/14/2013   PLT 210 12/14/2013    Assessment / Plan: Protracted active phase  Labor: Protracted dilation, Pitocin currently at 26u restarted at 4am after held for 4 hour break due to infrequent contractions at 40u units Preeclampsia:  on magnesium sulfate and labetalol, 130-150s/70-90s Fetal Wellbeing:  Category I Pain Control:  Epidural I/D:  GBS neg Anticipated MOD:  NSVD  Kayla Bautista, Kayla Bautista 12/15/2013, 10:17 AM

## 2013-12-15 NOTE — Progress Notes (Signed)
Kayla Bautista is a 23 y.o. G1P0 at 4291w5d by ultrasound admitted for induction of labor due to preeclampsia.  Subjective: Still sleeping  Objective: BP 143/61  Pulse 95  Temp(Src) 99.1 F (37.3 C) (Oral)  Resp 22  Ht 5\' 8"  (1.727 m)  Wt 154.223 kg (340 lb)  BMI 51.71 kg/m2  SpO2 100%  LMP 03/26/2013 I/O last 3 completed shifts: In: 7383.5 [P.O.:3300; I.V.:4083.5] Out: 4400 [Urine:4400] Total I/O In: 1804.6 [P.O.:540; I.V.:1264.6] Out: 650 [Urine:650]  FHT:  FHR: 130 bpm, variability: minimal ,  accelerations:  Present,  decelerations:  Absent UC:   regular, every 4 minutes SVE:   Dilation: 6 Effacement (%): 80 Station: -1 Exam by:: e. poore, rn  Labs: Lab Results  Component Value Date   WBC 10.9* 12/14/2013   HGB 11.7* 12/14/2013   HCT 35.7* 12/14/2013   MCV 84.8 12/14/2013   PLT 210 12/14/2013    Assessment / Plan: Protracted active phase  Labor: Protracted dilation, Pitocin restarted after 4 hour break, contractions inadequate,  continue increasing pit  Preeclampsia:  on magnesium sulfate and labetalol, 130-150s/70-90s Fetal Wellbeing:  Category II but reassuring Pain Control:  Epidural I/D:  GBS neg Anticipated MOD:  NSVD  Kayla Bautista, Kayla Bautista C 12/15/2013, 5:55 AM

## 2013-12-15 NOTE — Progress Notes (Signed)
Kayla Bautista is a 23 y.o. G1P0 at 4033w5d by ultrasound admitted for induction of labor due to preeclampsia.  Subjective: Pt is comfortable; No complaints.  Objective: BP 151/83  Pulse 89  Temp(Src) 98.4 F (36.9 C) (Oral)  Resp 20  Ht 5\' 8"  (1.727 m)  Wt 154.223 kg (340 lb)  BMI 51.71 kg/m2  SpO2 100%  LMP 03/26/2013 I/O last 3 completed shifts: In: 7383.5 [P.O.:3300; I.V.:4083.5] Out: 4400 [Urine:4400] Total I/O In: 874.6 [P.O.:360; I.V.:514.6] Out: 250 [Urine:250]  FHT:  FHR: 125 bpm, variability: minimal ,  accelerations:  Present,  decelerations:  Absent UC:   regular, every 4 minutes SVE:   Dilation: 6.5 Effacement (%): 80 Station: -1 Exam by:: e. poore, rn  Labs: Lab Results  Component Value Date   WBC 10.9* 12/14/2013   HGB 11.7* 12/14/2013   HCT 35.7* 12/14/2013   MCV 84.8 12/14/2013   PLT 210 12/14/2013    Assessment / Plan: Protracted active phase  Labor: Protracted dilation, Pitocin increased to 40 units Preeclampsia:  on magnesium sulfate and labetalol, consistent readings in the 150s systolic, 80s diastiolic range Fetal Wellbeing:  Category I Pain Control:  Epidural and Fentanyl I/D:  GBS neg Anticipated MOD:  NSVD  [name], [name] 12/15/2013, 12:01 AM

## 2013-12-15 NOTE — Progress Notes (Signed)
Kayla Bautista is a 23 y.o. G1P0 at 4358w5d by ultrasound admitted for induction of labor due to preeclampsia.  Subjective: Pt sleeping  Objective: BP 149/81  Pulse 84  Temp(Src) 98.5 F (36.9 C) (Oral)  Resp 20  Ht 5\' 8"  (1.727 m)  Wt 154.223 kg (340 lb)  BMI 51.71 kg/m2  SpO2 100%  LMP 03/26/2013 I/O last 3 completed shifts: In: 7383.5 [P.O.:3300; I.V.:4083.5] Out: 4400 [Urine:4400] Total I/O In: 1184.6 [P.O.:420; I.V.:764.6] Out: 250 [Urine:250]  FHT:  FHR: 130 bpm, variability: minimal ,  accelerations:  Present,  decelerations:  Absent UC:   regular, every 4 minutes SVE:   Dilation: 6.5 Effacement (%): 80 Station: -1 Exam by:: e. poore, rn  Labs: Lab Results  Component Value Date   WBC 10.9* 12/14/2013   HGB 11.7* 12/14/2013   HCT 35.7* 12/14/2013   MCV 84.8 12/14/2013   PLT 210 12/14/2013    Assessment / Plan: Protracted active phase  Labor: Protracted dilation, Pitocin held for 4 hour break due to infrequent contractions.  units Preeclampsia:  on magnesium sulfate and labetalol, 130-150s/70-90s Fetal Wellbeing:  Category II but reassuring Pain Control:  Epidural I/D:  GBS neg Anticipated MOD:  NSVD  Kayla Bautista, Kayla Bautista C 12/15/2013, 1:27 AM

## 2013-12-16 ENCOUNTER — Other Ambulatory Visit: Payer: Medicaid Other

## 2013-12-16 ENCOUNTER — Ambulatory Visit (HOSPITAL_COMMUNITY): Payer: Medicaid Other

## 2013-12-16 MED ORDER — SODIUM CHLORIDE 0.9 % IJ SOLN
3.0000 mL | Freq: Two times a day (BID) | INTRAMUSCULAR | Status: DC
Start: 2013-12-16 — End: 2013-12-17
  Administered 2013-12-16 (×2): 3 mL via INTRAVENOUS

## 2013-12-16 MED ORDER — AMLODIPINE BESYLATE 10 MG PO TABS
10.0000 mg | ORAL_TABLET | Freq: Every day | ORAL | Status: DC
  Administered 2013-12-16 – 2013-12-17 (×2): 10 mg via ORAL
  Filled 2013-12-16 (×3): qty 1

## 2013-12-16 MED ORDER — SODIUM CHLORIDE 0.9 % IJ SOLN: 3.0000 mL | INTRAMUSCULAR | Status: DC | PRN

## 2013-12-16 MED ORDER — LABETALOL HCL 200 MG PO TABS
200.0000 mg | ORAL_TABLET | Freq: Three times a day (TID) | ORAL | Status: DC
  Filled 2013-12-16 (×5): qty 1

## 2013-12-16 NOTE — Lactation Note (Signed)
This note was copied from the chart of Girl Roque CashMichelle Kamaka. Lactation Consultation Note Initial consultation; RN called to inform LC that mom had decided to try breastfeeding in addition to formula.  Mom holding baby STS at this time, baby has just breast fed with RN assistance, baby sound asleep. Mom states that breastfeeding went well, denies pain, states she will try it for now. Offered encouragement and education regarding her decision to provide breast milk for baby. Reviewed baby and me book breast feeding basics. Reviewed community resources, lactation services, BFSG. Answered questions. Enc mom to call for next feeding, and if she has any other concerns or questions.  Patient Name: Girl Roque CashMichelle Bloor Today's Date: 12/16/2013 Reason for consult: Initial assessment   Maternal Data Formula Feeding for Exclusion: Yes Reason for exclusion: Mother's choice to formula feed on admision  Feeding Feeding Type: Breast Fed Length of feed: 30 min  LATCH Score/Interventions Latch: Grasps breast easily, tongue down, lips flanged, rhythmical sucking.  Audible Swallowing: Spontaneous and intermittent  Type of Nipple: Everted at rest and after stimulation  Comfort (Breast/Nipple): Soft / non-tender     Hold (Positioning): Assistance needed to correctly position infant at breast and maintain latch. Intervention(s): Breastfeeding basics reviewed;Support Pillows;Position options;Skin to skin  LATCH Score: 9  Lactation Tools Discussed/Used     Consult Status Consult Status: Follow-up Follow-up type: In-patient    Octavio MannsSanders, Trea Carnegie [facility] [place] [facility] [facility] 12/16/2013, 2:21 PM

## 2013-12-16 NOTE — Anesthesia Postprocedure Evaluation (Signed)
Anesthesia Post Note  Patient: Kayla Bautista  Procedure(s) Performed: * No procedures listed *  Anesthesia type: Epidural  Patient location: Mother/Baby  Post pain: Pain level controlled  Post assessment: Post-op Vital signs reviewed  Last Vitals:  Filed Vitals:   12/16/13 0700  BP: 125/87  Pulse: 79  Temp:   Resp:     Post vital signs: Reviewed  Level of consciousness: awake  Complications: No apparent anesthesia complications

## 2013-12-16 NOTE — Progress Notes (Signed)
Pt decided that she wanted to try breastfeeding per CN NT. Pt stated to RN that she just wanted to try breastfeeding but also wanted to given her baby formula. Discussed benefits of breastfeeding without adding formula. Beth from Lactation notified.

## 2013-12-16 NOTE — Progress Notes (Signed)
Post Partum Day 1 Subjective: no complaints  Objective: Blood pressure 125/87, pulse 79, temperature 98.6 F (37 C), temperature source Oral, resp. rate 18, height 5\' 8"  (1.727 m), weight 347 lb 3.2 oz (157.489 kg), last menstrual period 03/26/2013, SpO2 96.00%, unknown if currently breastfeeding.  Physical Exam:  General: alert, cooperative and no distress Lochia: appropriate Uterine Fundus: firm Incision: n/a DVT Evaluation: No evidence of DVT seen on physical exam.   Recent Labs  12/14/13 1605 12/15/13 1149  HGB 11.7* 11.5*  HCT 35.7* 35.2*    Assessment/Plan: Contraception undecidered, encouraged LARC CHTN SIPIH, BP controlled, will decrease labetalol Stop magnesium after 1100  LOS: 3 days   ARNOLD,JAMES 12/16/2013, 7:28 AM

## 2013-12-16 NOTE — Progress Notes (Signed)
Pt Rm assignment changed to 111. Called & updated Alfonzo FellerHeather Gaither, RN who rec'd report from Phillips HayJessica Reynolds, RN after room changed.

## 2013-12-16 NOTE — Lactation Note (Signed)
This note was copied from the chart of Kayla Roque CashMichelle Picotte. Lactation Consultation Note  Patient Name: Kayla Bautista ZOXWR'UToday's Date: 12/16/2013 Reason for consult: Follow-up assessment Baby awake and rooting , LC assisted with latch and positioning  Baby opened wide and latch 1st try with multiply swallows, increased with breast compressions, Reviewed basics with mom , breast massage , hand express, and breast compressions with latch, And intermittent . Mom has large breast ,erect nipples, compressible areolas , colostrum noted with hand expressing. LC stressed the importance of getting depth at the breast.    Maternal Data Formula Feeding for Exclusion: Yes Reason for exclusion: Mother's choice to formula feed on admision Has patient been taught Hand Expression?: Yes  Feeding Feeding Type: Breast Fed (right breast , football ) Length of feed:  (feeding 6 mins and still feeding with multiply swallows )  LATCH Score/Interventions Latch: Grasps breast easily, tongue down, lips flanged, rhythmical sucking. Intervention(s): Adjust position;Breast massage;Assist with latch;Breast compression  Audible Swallowing: Spontaneous and intermittent  Type of Nipple: Everted at rest and after stimulation  Comfort (Breast/Nipple): Soft / non-tender     Hold (Positioning): Assistance needed to correctly position infant at breast and maintain latch. (worked on depth and positioning ) Intervention(s): Breastfeeding basics reviewed;Support Pillows;Position options;Skin to skin  LATCH Score: 9  Lactation Tools Discussed/Used     Consult Status Consult Status: Follow-up Date: 12/17/13 Follow-up type: In-patient    Kathrin Greathouseorio, Cheyenne Bordeaux Ann 12/16/2013, 5:21 PM

## 2013-12-17 DIAGNOSIS — M79609 Pain in unspecified limb: Secondary | ICD-10-CM

## 2013-12-17 LAB — TYPE AND SCREEN
ABO/RH(D): O NEG
Antibody Screen: POSITIVE
DAT, IgG: NEGATIVE
Unit division: 0
Unit division: 0

## 2013-12-17 MED ORDER — RHO D IMMUNE GLOBULIN 1500 UNIT/2ML IJ SOLN
300.0000 ug | Freq: Once | INTRAMUSCULAR | Status: AC
  Administered 2013-12-17: 300 ug via INTRAMUSCULAR
  Filled 2013-12-17: qty 2

## 2013-12-17 MED ORDER — AMLODIPINE BESYLATE 10 MG PO TABS: 10.0000 mg | ORAL_TABLET | Freq: Every day | ORAL | Status: DC

## 2013-12-17 MED ORDER — IBUPROFEN 600 MG PO TABS: 600.0000 mg | ORAL_TABLET | Freq: Four times a day (QID) | ORAL | Status: DC

## 2013-12-17 NOTE — Progress Notes (Signed)
Technician here to do doppler study of left calf.

## 2013-12-17 NOTE — Progress Notes (Signed)
*  PRELIMINARY RESULTS* Vascular Ultrasound Left lower extremity venous duplex has been completed.  Preliminary findings: No evidence of DVT in visualized veins.   Farrel DemarkJill Eunice, RDMS, RVT  12/17/2013, 12:29 PM

## 2013-12-17 NOTE — Discharge Instructions (Signed)

## 2013-12-17 NOTE — Discharge Summary (Signed)
Obstetric Discharge Summary Reason for Admis2sion: induction of labor Prenatal Procedures: NST, Preeclampsia and ultrasound Intrapartum Procedures: spontaneous vaginal delivery Postpartum Procedures: none Complications-Operative and Postpartum: none Hemoglobin  Date Value Ref Range Status  12/15/2013 11.5* 12.0 - 15.0 g/dL Final     HCT  Date Value Ref Range Status  12/15/2013 35.2* 36.0 - 46.0 % Final   Hospital Stay Roque CashMichelle Buick is a 23 y.o. G1P1 at 37.5w who presented for induction of labor due to chronic hypertension with superimposed preeclampsia. Pt has been seen in High Risk clinic since 5 wks IUP. Pregnancy is dated by 8 wks ultrasound. Pregnancy complicated with chronic hypertension, morbid obesity, and now superimposed preeclampsia. Blood pressure has been treated with labetalol 300 mg TID. During pregnancy TSH was low with a normal free T3 and free T4. Seen in clinic today and blood pressure was in the high 150's/80-100 range. Pt denies headache, epigastric pain or visual changes.At 11:00 AM a healthy female was delivered via Vaginal, Spontaneous Delivery (Presentation: OA). APGAR: 9, 10. Upon arrival patient was complete and pushing. She pushed with good maternal effort to deliver a healthy girl. Baby delivered without difficulty, was noticed to have good tone and place on maternal abdomen for oral suctioning, drying and stimulation. Delayed cord clamping performed and cut by FOB. Placenta delivered intact with 3V cord. Vaginal canal and perineum was inspected and intact. Pitocin was started and uterus massaged until bleeding slowed. Counts of sharps, instruments, and lap pads were all correct.  By PPD#2 she is doing well and is deemed to have received the full benefit of her hospital stay. She will remain on Norvasc 10mg  qd at home and will have a Baby [facility] visit within 1 weeks and a 4wk visit at the clinic. She is breastfeeding and is undecided re contraception.    Physical Exam:   General: alert, cooperative, no distress and morbidly obese Lochia: appropriate Uterine Fundus: firm Incision: N/A DVT Evaluation: No evidence of DVT seen on physical exam. Negative Homan's sign.  Discharge Diagnoses: Term Pregnancy-delivered  Discharge Information: Date: 12/17/2013 Activity: unrestricted Diet: routine Medications: PNV, Ibuprofen and Amlodipine Condition: stable Instructions: refer to practice specific booklet Discharge to: home Follow-up Information   [facility] Call [facility]. (Call to schedule 6 wk postpartum appointment)    Specialty:  Obstetrics and Gynecology   Contact information:   [address] [place] [address] [phone]      Newborn Data: Live born female  Birth Weight: 6 lb 0.8 oz (2744 g) APGAR: 9, 10  Home with mother.  Selena LesserBraimah, Tina 12/17/2013, 7:08 AM  I have seen and examined this patient and I agree with the above. Cam HaiSHAW, Anzlee Hinesley CNM 8:48 AM 12/17/2013

## 2013-12-17 NOTE — Progress Notes (Signed)
Pt complained at time of discharge of left calf pain and tenderness. No obvious swelling. Duplex obtained and negative for DVT. Reassurance given. D/c as planned after rhogam.

## 2013-12-18 LAB — RH IG WORKUP (INCLUDES ABO/RH)
ABO/RH(D): O NEG
Fetal Screen: NEGATIVE
Gestational Age(Wks): 37.5
Unit division: 0

## 2013-12-18 NOTE — Discharge Summary (Signed)
Attestation of Attending Supervision of Advanced Practitioner: Evaluation and management procedures were performed by the PA/NP/CNM/OB Fellow under my supervision/collaboration. Chart reviewed and agree with management and plan.  Arayla Kruschke V 12/18/2013 11:25 PM

## 2013-12-20 NOTE — Progress Notes (Signed)
Post discharge chart review completed.  

## 2014-01-12 ENCOUNTER — Encounter: Payer: Self-pay | Admitting: Obstetrics & Gynecology

## 2014-01-23 ENCOUNTER — Ambulatory Visit: Payer: Medicaid Other | Admitting: Nurse Practitioner

## 2014-02-22 ENCOUNTER — Ambulatory Visit: Payer: Medicaid Other | Admitting: Advanced Practice Midwife

## 2014-03-03 ENCOUNTER — Ambulatory Visit (INDEPENDENT_AMBULATORY_CARE_PROVIDER_SITE_OTHER): Payer: Medicaid Other | Admitting: Advanced Practice Midwife

## 2014-03-03 VITALS — BP 112/79 | HR 91 | Temp 98.5°F | Wt 327.4 lb

## 2014-03-03 DIAGNOSIS — Z3009 Encounter for other general counseling and advice on contraception: Secondary | ICD-10-CM

## 2014-03-03 DIAGNOSIS — Z6841 Body Mass Index (BMI) 40.0 and over, adult: Secondary | ICD-10-CM

## 2014-03-03 DIAGNOSIS — Z3202 Encounter for pregnancy test, result negative: Secondary | ICD-10-CM

## 2014-03-03 DIAGNOSIS — Z30017 Encounter for initial prescription of implantable subdermal contraceptive: Secondary | ICD-10-CM

## 2014-03-03 DIAGNOSIS — I10 Essential (primary) hypertension: Secondary | ICD-10-CM | POA: Insufficient documentation

## 2014-03-03 LAB — POCT PREGNANCY, URINE: Preg Test, Ur: NEGATIVE

## 2014-03-03 NOTE — Progress Notes (Signed)
Patient ID: Kayla Bautista, female   DOB: 11-Jun-1991, 23 y.o.   MRN: [id number] Pt reports unprotected sex within past 2 wks, informed patient to have only protected sex and return for pregnancy test/Nexplanon

## 2014-03-03 NOTE — Patient Instructions (Signed)
Etonogestrel implant What is this medicine? ETONOGESTREL (et oh noe JES trel) is a contraceptive (birth control) device. It is used to prevent pregnancy. It can be used for up to 3 years. This medicine may be used for other purposes; ask your health care provider or pharmacist if you have questions. COMMON BRAND NAME(S): Implanon, Nexplanon  What should I tell my health care provider before I take this medicine? They need to know if you have any of these conditions: -abnormal vaginal bleeding -blood vessel disease or blood clots -cancer of the breast, cervix, or liver -depression -diabetes -gallbladder disease -headaches -heart disease or recent heart attack -high blood pressure -high cholesterol -kidney disease -liver disease -renal disease -seizures -tobacco smoker -an unusual or allergic reaction to etonogestrel, other hormones, anesthetics or antiseptics, medicines, foods, dyes, or preservatives -pregnant or trying to get pregnant -breast-feeding How should I use this medicine? This device is inserted just under the skin on the inner side of your upper arm by a health care professional. Talk to your pediatrician regarding the use of this medicine in children. Special care may be needed. Overdosage: If you think you've taken too much of this medicine contact a poison control center or emergency room at once. Overdosage: If you think you have taken too much of this medicine contact a poison control center or emergency room at once. NOTE: This medicine is only for you. Do not share this medicine with others. What if I miss a dose? This does not apply. What may interact with this medicine? Do not take this medicine with any of the following medications: -amprenavir -bosentan -fosamprenavir This medicine may also interact with the following medications: -barbiturate medicines for inducing sleep or treating seizures -certain medicines for fungal infections like ketoconazole and  itraconazole -griseofulvin -medicines to treat seizures like carbamazepine, felbamate, oxcarbazepine, phenytoin, topiramate -modafinil -phenylbutazone -rifampin -some medicines to treat HIV infection like atazanavir, indinavir, lopinavir, nelfinavir, tipranavir, ritonavir -St. John's wort This list may not describe all possible interactions. Give your health care provider a list of all the medicines, herbs, non-prescription drugs, or dietary supplements you use. Also tell them if you smoke, drink alcohol, or use illegal drugs. Some items may interact with your medicine. What should I watch for while using this medicine? This product does not protect you against HIV infection (AIDS) or other sexually transmitted diseases. You should be able to feel the implant by pressing your fingertips over the skin where it was inserted. Tell your doctor if you cannot feel the implant. What side effects may I notice from receiving this medicine? Side effects that you should report to your doctor or health care professional as soon as possible: -allergic reactions like skin rash, itching or hives, swelling of the face, lips, or tongue -breast lumps -changes in vision -confusion, trouble speaking or understanding -dark urine -depressed mood -general ill feeling or flu-like symptoms -light-colored stools -loss of appetite, nausea -right upper belly pain -severe headaches -severe pain, swelling, or tenderness in the abdomen -shortness of breath, chest pain, swelling in a leg -signs of pregnancy -sudden numbness or weakness of the face, arm or leg -trouble walking, dizziness, loss of balance or coordination -unusual vaginal bleeding, discharge -unusually weak or tired -yellowing of the eyes or skin Side effects that usually do not require medical attention (Report these to your doctor or health care professional if they continue or are bothersome.): -acne -breast pain -changes in  weight -cough -fever or chills -headache -irregular menstrual bleeding -itching, burning,   and vaginal discharge -pain or difficulty passing urine -sore throat This list may not describe all possible side effects. Call your doctor for medical advice about side effects. You may report side effects to FDA at 1-800-FDA-1088. Where should I keep my medicine? This drug is given in a hospital or clinic and will not be stored at home. NOTE: This sheet is a summary. It may not cover all possible information. If you have questions about this medicine, talk to your doctor, pharmacist, or health care provider.  2014, Elsevier/Gold Standard. (2012-03-22 15:37:45)  

## 2014-03-03 NOTE — Progress Notes (Signed)
  Subjective:     Kayla Bautista is a 23 y.o. female who presents for a postpartum visit. She is 3 months postpartum following a spontaneous vaginal delivery. I have fully reviewed the prenatal and intrapartum course. The delivery was at 37.5 gestational weeks. Outcome: spontaneous vaginal delivery. Anesthesia: epidural. Postpartum course has been uncomplicated. Baby's course has been uncomplicated. Baby is feeding by both breast and bottle -  . Bleeding no bleeding and has resumed menstrual periods. Bowel function is normal. Bladder function is normal. Patient is sexually active. Contraception method is condoms. Postpartum depression screening: negative.  The following portions of the patient's history were reviewed and updated as appropriate: allergies, current medications, past family history, past medical history, past social history, past surgical history and problem list.  Review of Systems Pertinent items are noted in HPI.   Objective:    BP 112/79  Pulse 91  Temp(Src) 98.5 F (36.9 C)  Wt 148.508 kg (327 lb 6.4 oz)  LMP 01/31/2014  Breastfeeding? No  General:  alert, cooperative, appears stated age, no distress and morbidly obese   Breasts:  declined  Lungs: clear to auscultation bilaterally  Heart:  regular rate and rhythm, S1, S2 normal, no murmur, click, rub or gallop  Abdomen: soft, non-tender; bowel sounds normal; no masses,  no organomegaly   Vulva:  not evaluated  Vagina: not evaluated  Cervix:  not evaluated  Corpus: not examined  Adnexa:  not evaluated  Rectal Exam: Not performed.        Assessment:     Normal postpartum exam. Pap smear not done at today's visit.   Plan:    1. Contraception: Nexplanon 2. Abstain until insertion in 2 weeks. 3. Follow up in: 2 weeks or as needed.

## 2014-03-24 ENCOUNTER — Ambulatory Visit (INDEPENDENT_AMBULATORY_CARE_PROVIDER_SITE_OTHER): Payer: Medicaid Other | Admitting: Medical

## 2014-03-24 ENCOUNTER — Encounter: Payer: Self-pay | Admitting: Medical

## 2014-03-24 VITALS — BP 156/99 | HR 83 | Temp 98.3°F | Ht 66.0 in | Wt 331.7 lb

## 2014-03-24 DIAGNOSIS — Z3049 Encounter for surveillance of other contraceptives: Secondary | ICD-10-CM

## 2014-03-24 DIAGNOSIS — Z30017 Encounter for initial prescription of implantable subdermal contraceptive: Secondary | ICD-10-CM

## 2014-03-24 MED ORDER — ETONOGESTREL 68 MG ~~LOC~~ IMPL
68.0000 mg | DRUG_IMPLANT | Freq: Once | SUBCUTANEOUS | Status: AC
  Administered 2014-03-24: 68 mg via SUBCUTANEOUS

## 2014-03-24 NOTE — Patient Instructions (Signed)
Etonogestrel implant What is this medicine? ETONOGESTREL (et oh noe JES trel) is a contraceptive (birth control) device. It is used to prevent pregnancy. It can be used for up to 3 years. This medicine may be used for other purposes; ask your health care provider or pharmacist if you have questions. COMMON BRAND NAME(S): Implanon, Nexplanon What should I tell my health care provider before I take this medicine? They need to know if you have any of these conditions: -abnormal vaginal bleeding -blood vessel disease or blood clots -cancer of the breast, cervix, or liver -depression -diabetes -gallbladder disease -headaches -heart disease or recent heart attack -high blood pressure -high cholesterol -kidney disease -liver disease -renal disease -seizures -tobacco smoker -an unusual or allergic reaction to etonogestrel, other hormones, anesthetics or antiseptics, medicines, foods, dyes, or preservatives -pregnant or trying to get pregnant -breast-feeding How should I use this medicine? This device is inserted just under the skin on the inner side of your upper arm by a health care professional. Talk to your pediatrician regarding the use of this medicine in children. Special care may be needed. Overdosage: If you think you've taken too much of this medicine contact a poison control center or emergency room at once. Overdosage: If you think you have taken too much of this medicine contact a poison control center or emergency room at once. NOTE: This medicine is only for you. Do not share this medicine with others. What if I miss a dose? This does not apply. What may interact with this medicine? Do not take this medicine with any of the following medications: -amprenavir -bosentan -fosamprenavir This medicine may also interact with the following medications: -barbiturate medicines for inducing sleep or treating seizures -certain medicines for fungal infections like ketoconazole and  itraconazole -griseofulvin -medicines to treat seizures like carbamazepine, felbamate, oxcarbazepine, phenytoin, topiramate -modafinil -phenylbutazone -rifampin -some medicines to treat HIV infection like atazanavir, indinavir, lopinavir, nelfinavir, tipranavir, ritonavir -St. John's wort This list may not describe all possible interactions. Give your health care provider a list of all the medicines, herbs, non-prescription drugs, or dietary supplements you use. Also tell them if you smoke, drink alcohol, or use illegal drugs. Some items may interact with your medicine. What should I watch for while using this medicine? This product does not protect you against HIV infection (AIDS) or other sexually transmitted diseases. You should be able to feel the implant by pressing your fingertips over the skin where it was inserted. Tell your doctor if you cannot feel the implant. What side effects may I notice from receiving this medicine? Side effects that you should report to your doctor or health care professional as soon as possible: -allergic reactions like skin rash, itching or hives, swelling of the face, lips, or tongue -breast lumps -changes in vision -confusion, trouble speaking or understanding -dark urine -depressed mood -general ill feeling or flu-like symptoms -light-colored stools -loss of appetite, nausea -right upper belly pain -severe headaches -severe pain, swelling, or tenderness in the abdomen -shortness of breath, chest pain, swelling in a leg -signs of pregnancy -sudden numbness or weakness of the face, arm or leg -trouble walking, dizziness, loss of balance or coordination -unusual vaginal bleeding, discharge -unusually weak or tired -yellowing of the eyes or skin Side effects that usually do not require medical attention (Report these to your doctor or health care professional if they continue or are bothersome.): -acne -breast pain -changes in  weight -cough -fever or chills -headache -irregular menstrual bleeding -itching, burning, and   vaginal discharge -pain or difficulty passing urine -sore throat This list may not describe all possible side effects. Call your doctor for medical advice about side effects. You may report side effects to FDA at 1-800-FDA-1088. Where should I keep my medicine? This drug is given in a hospital or clinic and will not be stored at home. NOTE: This sheet is a summary. It may not cover all possible information. If you have questions about this medicine, talk to your doctor, pharmacist, or health care provider.  2015, Elsevier/Gold Standard. (2012-03-22 15:37:45)  

## 2014-03-24 NOTE — Progress Notes (Signed)
GYNECOLOGY CLINIC PROCEDURE NOTE  Kayla Bautista is a 23 y.o. G1P1001 here for Nexplanon insertion. No GYN concerns.  Last pap smear was on 05/25/13 and was normal.  No other gynecologic concerns.  Nexplanon Insertion Procedure Patient was given informed consent, she signed consent form.  Patient does understand that irregular bleeding is a very common side effect of this medication. She was advised to have backup contraception for one week after placement. Pregnancy test in clinic today was negative.  Appropriate time out taken.  Patient's left arm was prepped and draped in the usual sterile fashion. Patient was prepped with alcohol swab and then injected with 3 ml of 1% lidocaine.  She was prepped with betadine, Nexplanon removed from packaging,  Device confirmed in needle, then inserted full length of needle and withdrawn per handbook instructions. Nexplanon was able to palpated in the patient's arm; patient palpated the insert herself. There was minimal blood loss.  Patient insertion site covered with guaze and a pressure bandage to reduce any bruising.  The patient tolerated the procedure well and was given post procedure instructions.   Kayla StarrJulie N Ethier, PA-C  03/24/2014 11:08 AM

## 2014-03-24 NOTE — Addendum Note (Signed)
Addended by: Faythe CasaBELLAMY, JEANETTA M on: 03/24/2014 11:27 AM   Modules accepted: Orders

## 2014-03-27 LAB — POCT PREGNANCY, URINE: Preg Test, Ur: NEGATIVE

## 2014-05-23 ENCOUNTER — Encounter: Payer: Self-pay | Admitting: General Practice

## 2014-07-31 ENCOUNTER — Encounter: Payer: Self-pay | Admitting: Medical

## 2014-08-30 ENCOUNTER — Encounter: Payer: Self-pay | Admitting: Family Medicine

## 2015-02-24 ENCOUNTER — Emergency Department (HOSPITAL_COMMUNITY)
Admission: EM | Admit: 2015-02-24 | Discharge: 2015-02-24 | Disposition: A | Discharge status: Home / Self Care | Payer: Medicaid Other | Attending: Emergency Medicine | Admitting: Emergency Medicine

## 2015-02-24 ENCOUNTER — Encounter (HOSPITAL_COMMUNITY): Payer: Self-pay | Admitting: *Deleted

## 2015-02-24 DIAGNOSIS — Y9289 Other specified places as the place of occurrence of the external cause: Secondary | ICD-10-CM | POA: Insufficient documentation

## 2015-02-24 DIAGNOSIS — Z79899 Other long term (current) drug therapy: Secondary | ICD-10-CM | POA: Insufficient documentation

## 2015-02-24 DIAGNOSIS — E669 Obesity, unspecified: Secondary | ICD-10-CM | POA: Insufficient documentation

## 2015-02-24 DIAGNOSIS — I10 Essential (primary) hypertension: Secondary | ICD-10-CM | POA: Insufficient documentation

## 2015-02-24 DIAGNOSIS — Y998 Other external cause status: Secondary | ICD-10-CM | POA: Insufficient documentation

## 2015-02-24 DIAGNOSIS — Z791 Long term (current) use of non-steroidal anti-inflammatories (NSAID): Secondary | ICD-10-CM | POA: Insufficient documentation

## 2015-02-24 DIAGNOSIS — Z8619 Personal history of other infectious and parasitic diseases: Secondary | ICD-10-CM | POA: Insufficient documentation

## 2015-02-24 DIAGNOSIS — Y9389 Activity, other specified: Secondary | ICD-10-CM | POA: Insufficient documentation

## 2015-02-24 DIAGNOSIS — S01511A Laceration without foreign body of lip, initial encounter: Secondary | ICD-10-CM

## 2015-02-24 DIAGNOSIS — Z8669 Personal history of other diseases of the nervous system and sense organs: Secondary | ICD-10-CM | POA: Insufficient documentation

## 2015-02-24 DIAGNOSIS — Z720 Tobacco use: Secondary | ICD-10-CM | POA: Insufficient documentation

## 2015-02-24 NOTE — Discharge Instructions (Signed)
Facial Laceration °A facial laceration is a cut on the face. These injuries can be painful and cause bleeding. Some cuts may need to be closed with stitches (sutures), skin adhesive strips, or wound glue. Cuts usually heal quickly but can leave a scar. It can take 1-2 years for the scar to go away completely. °HOME CARE  °· Only take medicines as told by your doctor. °· Follow your doctor's instructions for wound care. °For Stitches: °· Keep the cut clean and dry. °· If you have a bandage (dressing), change it at least once a day. Change the bandage if it gets wet or dirty, or as told by your doctor. °· Wash the cut with soap and water 2 times a day. Rinse the cut with water. Pat it dry with a clean towel. °· Put a thin layer of medicated cream on the cut as told by your doctor. °· You may shower after the first 24 hours. Do not soak the cut in water until the stitches are removed. °· Have your stitches removed as told by your doctor. °· Do not wear any makeup until a few days after your stitches are removed. °For Skin Adhesive Strips: °· Keep the cut clean and dry. °· Do not get the strips wet. You may take a bath, but be careful to keep the cut dry. °· If the cut gets wet, pat it dry with a clean towel. °· The strips will fall off on their own. Do not remove the strips that are still stuck to the cut. °For Wound Glue: °· You may shower or take baths. Do not soak or scrub the cut. Do not swim. Avoid heavy sweating until the glue falls off on its own. After a shower or bath, pat the cut dry with a clean towel. °· Do not put medicine or makeup on your cut until the glue falls off. °· If you have a bandage, do not put tape over the glue. °· Avoid lots of sunlight or tanning lamps until the glue falls off. °· The glue will fall off on its own in 5-10 days. Do not pick at the glue. °After Healing: °Put sunscreen on the cut for the first year to reduce your scar. °GET HELP RIGHT AWAY IF:  °· Your cut area gets red,  painful, or puffy (swollen). °· You see a yellowish-white fluid (pus) coming from the cut. °· You have chills or a fever. °MAKE SURE YOU:  °· Understand these instructions. °· Will watch your condition. °· Will get help right away if you are not doing well or get worse. °Document Released: 03/03/2008 Document Revised: 07/06/2013 Document Reviewed: 04/28/2013 °Exit[employer]® Patient Information ©2015 [employer]. This information is not intended to replace advice given to you by your health care provider. Make sure you discuss any questions you have with your health care provider. ° °
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
## 2015-02-24 NOTE — ED Notes (Signed)
Declined W/C at D/C and was escorted to lobby by RN. 
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
## 2015-08-26 ENCOUNTER — Emergency Department (HOSPITAL_COMMUNITY)
Admission: EM | Admit: 2015-08-26 | Discharge: 2015-08-26 | Disposition: A | Discharge status: Home / Self Care | Payer: Self-pay | Attending: Emergency Medicine | Admitting: Emergency Medicine

## 2015-08-26 ENCOUNTER — Emergency Department (HOSPITAL_COMMUNITY): Payer: Self-pay

## 2015-08-26 ENCOUNTER — Encounter (HOSPITAL_COMMUNITY): Payer: Self-pay

## 2015-08-26 ENCOUNTER — Emergency Department (HOSPITAL_COMMUNITY): Payer: Medicaid Other

## 2015-08-26 DIAGNOSIS — Y9389 Activity, other specified: Secondary | ICD-10-CM | POA: Insufficient documentation

## 2015-08-26 DIAGNOSIS — S82401A Unspecified fracture of shaft of right fibula, initial encounter for closed fracture: Secondary | ICD-10-CM | POA: Insufficient documentation

## 2015-08-26 DIAGNOSIS — S80212A Abrasion, left knee, initial encounter: Secondary | ICD-10-CM | POA: Insufficient documentation

## 2015-08-26 DIAGNOSIS — Z79899 Other long term (current) drug therapy: Secondary | ICD-10-CM | POA: Insufficient documentation

## 2015-08-26 DIAGNOSIS — Y9289 Other specified places as the place of occurrence of the external cause: Secondary | ICD-10-CM | POA: Insufficient documentation

## 2015-08-26 DIAGNOSIS — W108XXA Fall (on) (from) other stairs and steps, initial encounter: Secondary | ICD-10-CM | POA: Insufficient documentation

## 2015-08-26 DIAGNOSIS — F17210 Nicotine dependence, cigarettes, uncomplicated: Secondary | ICD-10-CM | POA: Insufficient documentation

## 2015-08-26 DIAGNOSIS — Y998 Other external cause status: Secondary | ICD-10-CM | POA: Insufficient documentation

## 2015-08-26 DIAGNOSIS — I10 Essential (primary) hypertension: Secondary | ICD-10-CM | POA: Insufficient documentation

## 2015-08-26 DIAGNOSIS — Z87440 Personal history of urinary (tract) infections: Secondary | ICD-10-CM | POA: Insufficient documentation

## 2015-08-26 DIAGNOSIS — E669 Obesity, unspecified: Secondary | ICD-10-CM | POA: Insufficient documentation

## 2015-08-26 MED ORDER — IBUPROFEN 600 MG PO TABS: 600.0000 mg | ORAL_TABLET | Freq: Four times a day (QID) | ORAL | Status: DC

## 2015-08-26 MED ORDER — TRAMADOL HCL 50 MG PO TABS: 50.0000 mg | ORAL_TABLET | Freq: Four times a day (QID) | ORAL | Status: DC | PRN

## 2015-08-26 MED ORDER — KETOROLAC TROMETHAMINE 30 MG/ML IJ SOLN
60.0000 mg | Freq: Once | INTRAMUSCULAR | Status: AC
  Administered 2015-08-26: 60 mg via INTRAMUSCULAR
  Filled 2015-08-26: qty 2

## 2015-08-26 NOTE — ED Provider Notes (Signed)
CSN: [id number]     Arrival date & time 08/26/15  0807 History   First MD Initiated Contact with Patient 08/26/15 0809     Chief Complaint  Patient presents with  . Ankle Injury     (Consider location/radiation/quality/duration/timing/severity/associated sxs/prior Treatment) HPI  Patient is a 23 year old who presents with right ankle and left knee pain after falling approximately 2 hours prior to arrival in the ED. Patient reports that she was walking down steps when she missed the last step, rolled her right ankle, and landed on her left knee. Patient immediately noticed pain and swelling to her right ankle and left knee. Also reports left toe pain. Patient reports that she was unable to bear weight on her right ankle due to the pain. She has not tried anything for the pain.   Past Medical History  Diagnosis Date  . Stye   . Infection     UTI  . Hypertension   . Obesity    Past Surgical History  Procedure Laterality Date  . No past surgeries     Family History  Problem Relation Age of Onset  . Diabetes Mother   . Diabetes Maternal Grandmother   . Diabetes Maternal Grandfather    Social History  Substance Use Topics  . Smoking status: Current Some Day Smoker -- 0.25 packs/day for 2 years    Types: Cigarettes    Last Attempt to Quit: 05/04/2013  . Smokeless tobacco: Never Used  . Alcohol Use: Yes     Comment: occ   OB History    Gravida Para Term Preterm AB TAB SAB Ectopic Multiple Living   Review of Systems  Musculoskeletal: Positive for arthralgias.  All other systems reviewed and are negative.     Allergies  Review of patient's allergies indicates no known allergies.  Home Medications   Prior to Admission medications   Medication Sig Start Date End Date Taking? Authorizing Provider  amLODipine (NORVASC) 10 MG tablet Take 1 tablet (10 mg total) by mouth daily. Patient not taking: Reported on 08/26/2015 12/17/13   Reva Bores, MD    ibuprofen (ADVIL,MOTRIN) 600 MG tablet Take 1 tablet (600 mg total) by mouth every 6 (six) hours. 08/26/15   Ardith Dark, MD  Prenatal Vit-Fe Fumarate-FA (PRENATAL MULTIVITAMIN) TABS tablet Take 1 tablet by mouth daily at 12 noon.    Historical Provider, MD  traMADol (ULTRAM) 50 MG tablet Take 1 tablet (50 mg total) by mouth every 6 (six) hours as needed. 08/26/15   Ardith Dark, MD   BP 161/98 mmHg  Pulse 81  Temp(Src) 99.3 F (37.4 C) (Oral)  Resp 16  Ht  (1.753 m)  Wt 108.863 kg  BMI 35.43 kg/m2  SpO2 100%  LMP 07/11/2015 (Within Days) Physical Exam  Constitutional: She is oriented to person, place, and time. She appears well-developed and well-nourished.  HENT:  Head: Normocephalic and atraumatic.  Eyes: EOM are normal. Pupils are equal, round, and reactive to light.  Neck: Normal range of motion. Neck supple.  Cardiovascular: Normal rate, regular rhythm and normal heart sounds.   Pulmonary/Chest: Effort normal and breath sounds normal. No respiratory distress.  Abdominal: Soft. Bowel sounds are normal. She exhibits no distension.  Musculoskeletal:       Right ankle: She exhibits swelling. Tenderness. Lateral malleolus tenderness found. Achilles tendon normal.       Legs:  Feet:  Left knee with 1-2cm shallow abrasion across anterior aspect of patella. No foreign bodies observed. No obvious effusions. Patella tender to palpation. Negative anterior and posterior drawer tests. No MCL or LCL laxity.   Neurological: She is alert and oriented to person, place, and time. No cranial nerve deficit.  Skin: Skin is warm and dry.  Psychiatric: She has a normal mood and affect. Her behavior is normal.  Nursing note and vitals reviewed.   ED Course  Procedures (including critical care time) Labs Review Labs Reviewed - No data to display  Imaging Review Dg Ankle Complete Right  08/26/2015  CLINICAL DATA:  Slipped and fell yesterday line stairs. Pain and swelling  lateral right ankle. EXAM: RIGHT ANKLE - COMPLETE 3+ VIEW COMPARISON:  None. FINDINGS: Soft tissue swelling laterally. Couple small bony fragments adjacent the tip of the fibula which may be due to acute or chronic fracture. Ankle mortise is within normal. Prominent os trigonum. IMPRESSION: Couple small fragments adjacent the tip of the fibula which may be due to acute or chronic chip fracture. Electronically Signed   By: Elberta Fortisaniel  Boyle M.D.   On: 08/26/2015 09:29   Dg Knee Complete 4 Views Left  08/26/2015  CLINICAL DATA:  Left knee pain, fall yesterday, small laceration over patella EXAM: LEFT KNEE - COMPLETE 4+ VIEW COMPARISON:  None. FINDINGS: Four views of the left knee submitted. No acute fracture or subluxation. Mild narrowing of medial joint compartment. No joint effusion. IMPRESSION: No acute fracture or subluxation. Mild narrowing of medial joint compartment. No joint effusion. Electronically Signed   By: Natasha MeadLiviu  Pop M.D.   On: 08/26/2015 09:29   Dg Toe Great Left  08/26/2015  CLINICAL DATA:  24 year old female with injury to the left great toe while walking down stairs yesterday. EXAM: LEFT GREAT TOE COMPARISON:  No priors. FINDINGS: Probable hallux valgus.  No acute displaced fracture or dislocation. IMPRESSION: 1. No acute radiographic abnormality of the left great toe. Electronically Signed   By: Trudie Reedaniel  Entrikin M.D.   On: 08/26/2015 10:11   I have personally reviewed and evaluated these images and lab results as part of my medical decision-making.   EKG Interpretation None      MDM   Final diagnoses:  Closed fibular fracture, right, initial encounter   Patient is a 24 year presenting with right ankle and left knee pain and swelling after suffering a fall onto concrete prior to arrival. Exam significant for right lateral malleolus tenderness and swelling and an abrasion to the left knee. Ankle plain films show distal fibular chip fracture. Left great toe and knee film negative.  Patient is neurovascularly intact distally. Will place in a cam walker. Discharge home with tramadol and ibuprofen and follow up with orthopedics/sports medicine. Return precautions reviewed.   Ardith Darkaleb M Parker, MD 08/26/15 1026  Gerhard Munchobert Lockwood, MD 09/01/15 [phone]

## 2015-08-26 NOTE — ED Notes (Signed)
Pt. Presents with complaint of R ankle injury. Pt. States she thinks she missed a step and landed wrong on the R foot. Pt. States she did fall when it happened, denies hitting head. Only other injury is abrasion to L knee. AxO x4.

## 2015-08-26 NOTE — ED Notes (Signed)
Patient transported to X-ray 

## 2015-08-26 NOTE — Discharge Instructions (Signed)
Fibular Ankle Fracture Treated With or Without Immobilization, Adult A fibular fracture at your ankle is a break (fracture) bone in the smallest of the two bones in your lower leg, located on the outside of your leg (fibula) close to the area at your ankle joint. CAUSES  Rolling your ankle.  Twisting your ankle.  Extreme flexing or extending of your foot.  Severe force on your ankle as when falling from a distance. RISK FACTORS  Jumping activities.  Participation in sports.  Osteoporosis.  Advanced age.  Previous ankle injuries. SIGNS AND SYMPTOMS  Pain.  Swelling.  Inability to put weight on injured ankle.  Bruising.  Bone deformities at site of injury. DIAGNOSIS  This fracture is diagnosed with the help of an X-ray exam. TREATMENT  If the fractured bone did not move out of place it usually will heal without problems and does casting or splinting. If immobilization is needed for comfort or the fractured bone moved out of place and will not heal properly with immobilization, a cast or splint will be used. HOME CARE INSTRUCTIONS   Apply ice to the area of injury:  Put ice in a plastic bag.  Place a towel between your skin and the bag.  Leave the ice on for 20 minutes, 2-3 times a day.  Use crutches as directed. Resume walking without crutches as directed by your health care provider.  Only take over-the-counter or prescription medicines for pain, discomfort, or fever as directed by your health care provider.  If you have a removable splint or boot, do not remove the boot unless directed by your health care provider. SEEK MEDICAL CARE IF:   You have continued pain or more swelling  The medications do not control the pain. SEEK IMMEDIATE MEDICAL CARE IF:  You develop severe pain in the leg or foot.  Your skin or nails below the injury turn blue or grey or feel cold or numb. MAKE SURE YOU:   Understand these instructions.  Will watch your  condition.  Will get help right away if you are not doing well or get worse.   This information is not intended to replace advice given to you by your health care provider. Make sure you discuss any questions you have with your health care provider.   Document Released: 09/15/2005 Document Revised: 10/06/2014 Document Reviewed: 04/27/2013 Elsevier Interactive Patient Education 2016 [employer].   Emergency Department Resource Guide 1) Find a Doctor and Pay Out of Pocket Although you won't have to find out who is covered by your insurance plan, it is a good idea to ask around and get recommendations. You will then need to call the office and see if the doctor you have chosen will accept you as a new patient and what types of options they offer for patients who are self-pay. Some doctors offer discounts or will set up payment plans for their patients who do not have insurance, but you will need to ask so you aren't surprised when you get to your appointment.  2) Contact Your Local Health Department Not all health departments have doctors that can see patients for sick visits, but many do, so it is worth a call to see if yours does. If you don't know where your local health department is, you can check in your phone book. The CDC also has a tool to help you locate your state's health department, and many state websites also have listings of all of their local health departments.  3)  Find a Walk-in Clinic If your illness is not likely to be very severe or complicated, you may want to try a walk in clinic. These are popping up all over the country in pharmacies, drugstores, and shopping centers. They're usually staffed by nurse practitioners or physician assistants that have been trained to treat common illnesses and complaints. They're usually fairly quick and inexpensive. However, if you have serious medical issues or chronic medical problems, these are probably not your best option.  No Primary  Care Doctor: - Call Health Connect at  [phone] - they can help you locate a primary care doctor that  accepts your insurance, provides certain services, etc. - Physician Referral Service- [phone]  Chronic Pain Problems: Organization         Address  Phone   Notes  [facility]  [phone] Patients need to be referred by their primary care doctor.   Medication Assistance: Organization         Address  Phone   Notes  [facility] [address] [place], [place] [address] [phone] --Must be a resident of [facility] -- Must have NO insurance coverage whatsoever (no Medicaid/ Medicare, etc.) -- The pt. MUST have a primary care doctor that directs their care regularly and follows them in the community   [facility]  [phone]) [phone]   [employer]  [phone]) [phone]    Agencies that provide inexpensive medical care: Organization         Address  Phone   Notes  [facility]  [phone]   [facility]    [phone]   [facility] [address] [place], [place] [address] [phone]   [facility] [address], [place] [phone]   [facility]    [phone]   [facility]    [phone]) [phone]   [facility]  [address], [place] Phone:  [phone], Fax:  [phone] Hours of Operation:  9 am - 6 pm, M-F.  Also accepts Medicaid/Medicare and self-pay.  [facility]  [address], [place] Phone: [phone], Fax: [phone]) [phone]. Hours of Operation:  8:30 am - 5:30 pm, M-F.  Also accepts Medicaid and self-pay.  [facility] [place] [address], [place] Phone: [phone]   [facility] [address], [place], [place] [phone], Ext. [phone] Mondays & Thursdays: 7-9 AM.  First 15 patients are seen on a first  come, first serve basis.    Medicaid-accepting [facility] Providers:  Organization         Address  Phone   Notes  [facility] [address], [place] [phone]) [phone] Also accepts self-pay patients.  [facility] [address], [place]  [phone]) [phone]   [facility] [address], [place] [phone]) [phone]   [facility] [address] [place] [address], [place] [phone]) [phone]   Renaye RakersVeita Bland [address], [place]   [phone] Only accepts [place] Access [place] patients after they have their name applied to their card.   Self-Pay (no insurance) in [facility] [place]:  Organization         Address  Phone   Notes  Sickle Cell Patients, [facility] [address], [place] [phone]   °[facility] [address], [place] [phone]   °[facility] [place] ° [address], [place] [phone]   °[facility]/Dr. Osei-Bonsu ° [address] [place] [address], [place] or [address], [place] [phone] Phone number for both [place] and [place] locations is the same.  °[facility] [address], [place] [phone]   °[facility] [address] [place] [address], [place] or [address] [phone]) [phone] °([phone]) [phone]   °[facility] [address], [place] [phone], phone; [phone], fax Sees patients 1st and 3rd Saturday of every month.  Must not qualify for public or private insurance (i.e. Medicaid, Medicare, [place] Health Choice, Veterans' Benefits) • Household income should be no more than 200% of the poverty level •The clinic cannot treat you if you are pregnant or think you are pregnant • Sexually transmitted diseases are not treated at the clinic.  ° ° °Dental  Care: °Organization         Address  Phone  Notes  °[facility] [address], [place] [phone]) [phone] Accepts children up to age 21 who are enrolled in Medicaid or [place] Health Choice; pregnant women with a Medicaid card; and children who have applied for Medicaid or [place] Health Choice, but were declined, whose parents can pay a reduced fee at time of service.  °[facility] [place]  [address], [place] [phone]) [phone] Accepts children up to age 21 who are enrolled in Medicaid or [place] Health Choice; pregnant women with a Medicaid card; and children who have applied for Medicaid or [place] Health Choice, but were declined, whose parents can pay a reduced fee at time of service.  °[facility] ° [address],  ([phone]) [phone] Patients are seen by appointment only. Walk-ins are not accepted. [facility] will see patients 18 years of age and older. °Monday - Tuesday (8am-5pm) °Most Wednesdays (8:30-5pm) °$30 per visit, cash only  °[facility] ° [address], [place] [phone]) [phone] Patients are seen by appointment only. Walk-ins are not accepted. [facility] will see patients 18 years of age and older. °One Wednesday Evening (Monthly: Volunteer Based).  $30 per visit, cash only  °[facility]  [phone] for adults; Children under age 4, call Graduate Pediatric Dentistry at [phone]. Children aged 4-14, please call [phone] to request a pediatric application. ° Dental services are provided in all areas of dental care including fillings, crowns and bridges, complete and partial dentures, implants, gum treatment, root canals, and extractions. Preventive care is also provided. Treatment is provided to both adults and children. °Patients are selected via a lottery and there is often a waiting list. °  °[facility] [address], ° ° ([phone]) [phone] [email or web address].drcivils.[email or web address] °  °[facility] [address], [place], [place] ([phone], Ext. [phone] Second and Fourth Thursday of each month, opens at 6:30 AM; Clinic ends at 9 AM.  Patients are seen on a first-come first-served basis, and a limited number are seen during each clinic.  ° °[facility] ° [address], [place],  ([phone]) [phone]   Eligibility Requirements °You must have lived in [place], or [place] counties for at least the last three months. °  You cannot be eligible for state or federal sponsored healthcare insurance, including Veterans Administration, Medicaid, or Medicare. °  You generally cannot be eligible for healthcare insurance through your employer.  °  How to apply: °Eligibility screenings are held every Tuesday and Wednesday afternoon from 1:00 pm until 4:00 pm. You do not need an appointment for the interview!  °[facility] [address], [place], [place] [phone]   °[facility]  336-342-8273   °[facility]  336-703-3100   °[facility]  336-570-6415   ° °Behavioral Health Resources in the Community: °Intensive Outpatient Programs °Organization         Address  Phone  Notes  °[facility] [place] [facility] [address], [place], [place] 336-878-6098   °[facility] [address], [place], [place] 336-832-9800   °[facility] [address], [place], [place] ° 336-882-2125   °[facility] [address],  °[place], [place] 1-800-853-5163 or 336-641-4981   °Substance Abuse Resources °Organization         Address  Phone  Notes  °[facility]  336-882-2125   °[facility]  336-784-9470   °[facility]  336-285-9073   °[facility]  336-845-3988   °Residential & Outpatient Substance Abuse Program  1-800-659-3381    °Psychological Services °Organization         Address  Phone  Notes  °[facility]  336- 832-9600   °[facility]  336- 378-7881   °[facility] [address], [place] 1-800-853-5163 or 336-641-4981   ° °Mobile Crisis Teams °Organization         Address  Phone  Notes  °[facility], Mobile Crisis Care Unit  1-877-626-1772   °Assertive °[facility] ° [address] [place], [place] 336-834-9664   °Sharon DeEsch [address] °[place] 336-554-5454   ° °Self-Help/Support Groups °Organization         Address  Phone             Notes  °[facility] [place] - variety of support groups  336- 373-1402 Call for more information  °Narcotics Anonymous (NA), [facility] [address], °[place]  2 meetings at this location  ° °Residential Treatment Programs °Organization         Address  Phone  Notes  °[facility] [address],    °[place]  1-866-801-8205   °[facility] ° [address], [place], [place] 704-293-8524   °[facility] [address], [place] 336-845-3988 Admissions: 8am-3pm M-F  °[facility] [address]    °[place], [place] 336-841-1104   °[facility] [address], [place], [place] 336-379-7146   °[facility] [address]  °[place], [place] 336-285-9073   °[facility] - Intensive Outpatient [address], , [place] 336-852-3033   °[facility] [address]  °[place],  1-877-615-2722 or 336-784-9470   °[facility] ([facility]) [address] [place],  336-227-7417 Accepts Medicaid  °[facility] [address]  °  1-800-659-3381 Substance Abuse/Addiction Treatment  ° °[place] Behavioral Health Resources °Organization         Address  Phone  Notes  °[facility]  (888) 581-9988   °Julie Brannon, PhD [address] [place], [place]   (336) 349-5553 or (336) 951-0000   °[facility]   [address] °[place], [place] (336) 349-4454   °[facility] [address], [place], [place] (336) 342-8316 Insurance/Medicaid/sponsorship through [facility]  °[facility] [address]                                    [place], [place] (336) 342-8316 Therapy/tele-psych/case  °[facility] [address]  ° [place], [place] (336) 349-2233    °Dr. Arfeen  (336) 349-4544   °[facility] [place]  [employer] [facility] [place] [facility] 1) [address], [place] °2) [address], [place] °3)  [address], [place] (336) 349-3220 °(336) 342-7768 ° °(336) 342-8140   °[place] Child Abuse Hotline (336) 342-1394 or (336) 342-3537 (After Hours)    ° ° ° °

## 2015-08-26 NOTE — ED Notes (Signed)
Patient returned from X-ray 

## 2015-10-04 ENCOUNTER — Telehealth: Payer: Self-pay | Admitting: *Deleted

## 2015-10-04 NOTE — Telephone Encounter (Signed)
Pt asked what she could do for a fractured ankle until she could get an appt.  I told pt that she needed to get the ankle evaluated quickly to decrease any untoward problem, she said she had been seen in the ER, but they didn't refer her to anyone.  Pt states she had no insurance, and was checking cost.  I told gave pt [facility]'s appt line and said they may be able to help her they have many specialties.  Pt thanked me.

## 2015-10-04 NOTE — Telephone Encounter (Signed)
Pt asked what to do for a fractured ankle, before she gets an appt.

## 2015-12-21 ENCOUNTER — Encounter: Payer: Self-pay | Admitting: Podiatry

## 2016-01-14 ENCOUNTER — Ambulatory Visit: Payer: Medicaid Other | Admitting: Podiatry

## 2016-01-22 ENCOUNTER — Encounter: Payer: Self-pay | Admitting: Obstetrics

## 2016-01-22 ENCOUNTER — Ambulatory Visit (INDEPENDENT_AMBULATORY_CARE_PROVIDER_SITE_OTHER): Payer: Medicaid Other | Admitting: Obstetrics

## 2016-01-22 VITALS — BP 175/101 | HR 71 | Ht 68.0 in | Wt 313.0 lb

## 2016-01-22 DIAGNOSIS — Z01419 Encounter for gynecological examination (general) (routine) without abnormal findings: Secondary | ICD-10-CM | POA: Diagnosis not present

## 2016-01-22 DIAGNOSIS — E669 Obesity, unspecified: Secondary | ICD-10-CM

## 2016-01-22 DIAGNOSIS — Z0000 Encounter for general adult medical examination without abnormal findings: Secondary | ICD-10-CM | POA: Diagnosis not present

## 2016-01-22 DIAGNOSIS — I10 Essential (primary) hypertension: Secondary | ICD-10-CM

## 2016-01-22 DIAGNOSIS — Z3040 Encounter for surveillance of contraceptives, unspecified: Secondary | ICD-10-CM

## 2016-01-22 MED ORDER — CARVEDILOL 12.5 MG PO TABS: 12.5000 mg | ORAL_TABLET | Freq: Two times a day (BID) | ORAL | Status: DC

## 2016-01-22 MED ORDER — TRIAMTERENE-HCTZ 37.5-25 MG PO CAPS: 1.0000 | ORAL_CAPSULE | Freq: Every day | ORAL | Status: DC

## 2016-01-22 NOTE — Progress Notes (Signed)
Subjective:        Kayla Bautista is a 25 y.o. female here for a routine exam.  Current complaints: None.    Personal health questionnaire:  Is patient Ashkenazi Jewish, have a family history of breast and/or ovarian cancer: no Is there a family history of uterine cancer diagnosed at age < 65, gastrointestinal cancer, urinary tract cancer, family member who is a Personnel officer syndrome-associated carrier: no Is the patient overweight and hypertensive, family history of diabetes, personal history of gestational diabetes, preeclampsia or PCOS: no Is patient over 46, have PCOS,  family history of premature CHD under age 62, diabetes, smoke, have hypertension or peripheral artery disease:  no At any time, has a partner hit, kicked or otherwise hurt or frightened you?: no Over the past 2 weeks, have you felt down, depressed or hopeless?: no Over the past 2 weeks, have you felt little interest or pleasure in doing things?:no   Gynecologic History No LMP recorded. Patient has had an implant. Contraception: none Last Pap: 2014. Results were: normal Last mammogram: n/a. Results were: n/a  Obstetric History OB History  Gravida Para Term Preterm AB SAB TAB Ectopic Multiple Living  # Outcome Date GA Lbr Len/2nd Weight Sex Delivery Anes PTL Lv  1 Term 12/15/13 [redacted]w[redacted]d 24:53 / 00:17 6 lb 0.8 oz (2.744 kg) F Vag-Spont EPI  Y      Past Medical History  Diagnosis Date  . Stye   . Infection     UTI  . Hypertension   . Obesity     Past Surgical History  Procedure Laterality Date  . No past surgeries       Current outpatient prescriptions:  .  amLODipine (NORVASC) 10 MG tablet, Take 1 tablet (10 mg total) by mouth daily. (Patient not taking: Reported on 08/26/2015), Disp: 60 tablet, Rfl: 0 .  ibuprofen (ADVIL,MOTRIN) 600 MG tablet, Take 1 tablet (600 mg total) by mouth every 6 (six) hours. (Patient not taking: Reported on 01/22/2016), Disp: 30 tablet, Rfl: 0 .  Prenatal Vit-Fe  Fumarate-FA (PRENATAL MULTIVITAMIN) TABS tablet, Take 1 tablet by mouth daily at 12 noon. Reported on 01/22/2016, Disp: , Rfl:  .  traMADol (ULTRAM) 50 MG tablet, Take 1 tablet (50 mg total) by mouth every 6 (six) hours as needed. (Patient not taking: Reported on 01/22/2016), Disp: 30 tablet, Rfl: 0 No Known Allergies  Social History  Substance Use Topics  . Smoking status: Current Some Day Smoker -- 0.25 packs/day for 2 years    Types: Cigarettes    Last Attempt to Quit: 05/04/2013  . Smokeless tobacco: Never Used  . Alcohol Use: No     Comment: occ    Family History  Problem Relation Age of Onset  . Diabetes Mother   . Diabetes Maternal Grandmother   . Diabetes Maternal Grandfather       Review of Systems  Constitutional: negative for fatigue and weight loss Respiratory: negative for cough and wheezing Cardiovascular: negative for chest pain, fatigue and palpitations Gastrointestinal: negative for abdominal pain and change in bowel habits Musculoskeletal:negative for myalgias Neurological: negative for gait problems and tremors Behavioral/Psych: negative for abusive relationship, depression Endocrine: negative for temperature intolerance   Genitourinary:negative for abnormal menstrual periods, genital lesions, hot flashes, sexual problems and vaginal discharge Integument/breast: negative for breast lump, breast tenderness, nipple discharge and skin lesion(s)    Objective:       BP  175/101 mmHg  Pulse 71  Ht 5\' 8"  (1.727 m)  Wt 313 lb (141.976 kg)  BMI 47.60 kg/m2 General:   alert  Skin:   no rash or abnormalities  Lungs:   clear to auscultation bilaterally  Heart:   regular rate and rhythm, S1, S2 normal, no murmur, click, rub or gallop  Breasts:   normal without suspicious masses, skin or nipple changes or axillary nodes  Abdomen:  normal findings: no organomegaly, soft, non-tender and no hernia  Pelvis:  External genitalia: normal general appearance Urinary system:  urethral meatus normal and bladder without fullness, nontender Vaginal: normal without tenderness, induration or masses Cervix: normal appearance Adnexa: normal bimanual exam Uterus: anteverted and non-tender, normal size   Lab Review Urine pregnancy test Labs reviewed yes Radiologic studies reviewed no   Assessment:    Healthy female exam.    Hypertension  Obesity  Contraceptive Surveillance.  Pleased with Nexplanon  Tobacco Dependence     Plan:   Coreg / Dyazide Rx Referred to Internal Medicine for BP follow up and routine health maintenance Continue Nexplanon Encouraged smoking cessation  Education reviewed: low fat, low cholesterol diet, safe sex/STD prevention, self breast exams and weight bearing exercise. Follow up in: 1 year.   No orders of the defined types were placed in this encounter.   Orders Placed This Encounter  Procedures  . NuSwab Vaginitis Plus (VG+)

## 2016-01-23 LAB — PAP IG W/ RFLX HPV ASCU: PAP Smear Comment: 0

## 2016-01-25 ENCOUNTER — Ambulatory Visit (INDEPENDENT_AMBULATORY_CARE_PROVIDER_SITE_OTHER): Payer: Medicaid Other | Admitting: Podiatry

## 2016-01-25 ENCOUNTER — Ambulatory Visit (INDEPENDENT_AMBULATORY_CARE_PROVIDER_SITE_OTHER): Payer: Medicaid Other

## 2016-01-25 ENCOUNTER — Encounter: Payer: Self-pay | Admitting: Podiatry

## 2016-01-25 VITALS — BP 161/104 | HR 82 | Resp 16

## 2016-01-25 DIAGNOSIS — S93491A Sprain of other ligament of right ankle, initial encounter: Secondary | ICD-10-CM

## 2016-01-25 DIAGNOSIS — S93401A Sprain of unspecified ligament of right ankle, initial encounter: Secondary | ICD-10-CM

## 2016-01-25 DIAGNOSIS — M25371 Other instability, right ankle: Secondary | ICD-10-CM

## 2016-01-25 MED ORDER — MELOXICAM 15 MG PO TABS: 15.0000 mg | ORAL_TABLET | Freq: Every day | ORAL | Status: DC

## 2016-01-25 NOTE — Progress Notes (Signed)
Subjective:     Patient ID: Kayla CashMichelle Bautista, female   DOB: 01/18/1991, 25 y.o.   MRN: [id number]  HPI 25 year old female presents the office today for concerns of right ankle pain. She states that she felt a curb injuring her right ankle on November 27. She went to the emergency room she was in the boot for 3-4 weeks. She did transition herself into an ankle brace. She states that since that she is having pain at the ankle. She says it swells instability. No redness or warmth. No other injury. No other treatment. No other complaints.  Review of Systems  All other systems reviewed and are negative.      Objective:   Physical Exam General: AAO x3, NAD  Dermatological: Skin is warm, dry and supple bilateral. Nails x 10 are well manicured; remaining integument appears unremarkable at this time. There are no open sores, no preulcerative lesions, no rash or signs of infection present.  Vascular: Dorsalis Pedis artery and Posterior Tibial artery pedal pulses are 2/4 bilateral with immedate capillary fill time. Pedal hair growth present. No varicosities and no lower extremity edema present bilateral. There is no pain with calf compression, swelling, warmth, erythema.   Neruologic: Grossly intact via light touch bilateral. Vibratory intact via tuning fork bilateral. Protective threshold with Semmes Wienstein monofilament intact to all pedal sites bilateral. Patellar and Achilles deep tendon reflexes 2+ bilateral. No Babinski or clonus noted bilateral.   Musculoskeletal:There is mild edema along the anterolateral aspect of the right ankle. There is tenderness on the course the ATFL and CFL the ankle. There is no area pinpoint bony centers pain vibratory sensation. There is a decrease in medial arch height. Equinus is present. No other areas of pinpoint bony tenderness. Ankle, subtalar joint range of motion intact. MMT 5/5.  Gait: Unassisted, Nonantalgic.      Assessment:     Chronic right ankle  sprain/instability status post injury right lateral ankle    Plan:     -Treatment options discussed including all alternatives, risks, and complications -Etiology of symptoms were discussed -X-rays were obtained and reviewed with the patient. No evidence of acute fracture or stress fracture. -At this point I recommend he continue with ankle brace. Also physical therapy which is ordered today. NSAIDs as needed.Prescribed mobic. Discussed side effects of the medication and directed to stop if any are to occur and call the office.  -Follow-up after physical therapy.

## 2016-01-26 LAB — NUSWAB VAGINITIS PLUS (VG+)
CANDIDA GLABRATA, NAA: NEGATIVE
Candida albicans, NAA: NEGATIVE
Chlamydia trachomatis, NAA: NEGATIVE
NEISSERIA GONORRHOEAE, NAA: NEGATIVE
Trich vag by NAA: NEGATIVE

## 2016-01-28 ENCOUNTER — Telehealth: Payer: Self-pay | Admitting: *Deleted

## 2016-01-28 ENCOUNTER — Ambulatory Visit: Payer: Medicaid Other | Admitting: Podiatry

## 2016-01-28 DIAGNOSIS — S93401S Sprain of unspecified ligament of right ankle, sequela: Secondary | ICD-10-CM

## 2016-01-28 DIAGNOSIS — M25371 Other instability, right ankle: Secondary | ICD-10-CM

## 2016-01-28 NOTE — Telephone Encounter (Addendum)
-----   Message from Vivi BarrackMatthew R Wagoner, DPM sent at 01/25/2016  2:42 PM EDT ----- Can you order PT for her for chronic lateral ankle pain right side. Pt states [facility] doesn't take her insurance and the [facility] doesn't take it for her age group.  I left message on [facility] [phone] to please let me know if they accept adult Medicaid and if not who does.  01/29/2016-I spoke with [facility] - Deanna ArtisKeisha and she states that Medicaid will only cover PT if it is within 90 days of a surgery and only once a year, pt can schedule for a one-time PT evaluation and that is considered the once a year appt, so if she is going to plan to have surgery that would be her one time a year appt, pt may pay for her PT out of pocket.  01/30/2016-DrArdelle Anton. Wagoner ordered home PT sheet for ankle sprain rehab exercises.  01/31/2016-Left message informing pt I had information concerning her PT and her insurance.  Printed Ankle Sprain Rehab/PT AVS with instructions to hold each exercise 30-60 seconds and to repeat as tolerated for 2-3 times a day and sent to pt's home with note requesting callback to discuss insurance and any other concerns.

## 2016-01-29 NOTE — Telephone Encounter (Signed)
Please give her the sheet on ankle sprain rehab exercises. She can start with this on her own at home and if she feels that she needs to go to PT we can do this later if needed so she doesn't "waste" her PT for the year.

## 2016-01-31 ENCOUNTER — Encounter: Payer: Self-pay | Admitting: *Deleted

## 2016-01-31 NOTE — Progress Notes (Signed)
Ankle sprain rehab/PT sent to pt.

## 2016-01-31 NOTE — Patient Instructions (Signed)

## 2016-02-10 ENCOUNTER — Ambulatory Visit (HOSPITAL_COMMUNITY)
Admission: EM | Admit: 2016-02-10 | Discharge: 2016-02-10 | Disposition: A | Discharge status: Home / Self Care | Payer: Medicaid Other | Attending: Emergency Medicine | Admitting: Emergency Medicine

## 2016-02-10 ENCOUNTER — Ambulatory Visit (HOSPITAL_COMMUNITY): Payer: Medicaid Other

## 2016-02-10 ENCOUNTER — Encounter (HOSPITAL_COMMUNITY): Payer: Self-pay | Admitting: *Deleted

## 2016-02-10 DIAGNOSIS — I10 Essential (primary) hypertension: Secondary | ICD-10-CM | POA: Diagnosis not present

## 2016-02-10 DIAGNOSIS — R109 Unspecified abdominal pain: Secondary | ICD-10-CM | POA: Diagnosis present

## 2016-02-10 DIAGNOSIS — F17210 Nicotine dependence, cigarettes, uncomplicated: Secondary | ICD-10-CM | POA: Diagnosis not present

## 2016-02-10 DIAGNOSIS — K529 Noninfective gastroenteritis and colitis, unspecified: Secondary | ICD-10-CM | POA: Diagnosis not present

## 2016-02-10 DIAGNOSIS — E669 Obesity, unspecified: Secondary | ICD-10-CM | POA: Diagnosis not present

## 2016-02-10 DIAGNOSIS — Z79899 Other long term (current) drug therapy: Secondary | ICD-10-CM | POA: Diagnosis not present

## 2016-02-10 LAB — COMPREHENSIVE METABOLIC PANEL
ALBUMIN: 3.9 g/dL (ref 3.5–5.0)
ALK PHOS: 60 U/L (ref 38–126)
ALT: 50 U/L (ref 14–54)
ANION GAP: 8 (ref 5–15)
AST: 30 U/L (ref 15–41)
BILIRUBIN TOTAL: 0.6 mg/dL (ref 0.3–1.2)
BUN: 9 mg/dL (ref 6–20)
CALCIUM: 9.2 mg/dL (ref 8.9–10.3)
CO2: 20 mmol/L — ABNORMAL LOW (ref 22–32)
Chloride: 110 mmol/L (ref 101–111)
Creatinine, Ser: 0.82 mg/dL (ref 0.44–1.00)
GFR calc non Af Amer: >60 mL/min (ref >60)
GLUCOSE: 99 mg/dL (ref 65–99)
POTASSIUM: 3.9 mmol/L (ref 3.5–5.1)
SODIUM: 138 mmol/L (ref 135–145)
TOTAL PROTEIN: 7.6 g/dL (ref 6.5–8.1)

## 2016-02-10 LAB — CBC
HEMATOCRIT: 42.2 % (ref 36.0–46.0)
HEMOGLOBIN: 13.4 g/dL (ref 12.0–15.0)
MCH: 27.1 pg (ref 26.0–34.0)
MCHC: 31.8 g/dL (ref 30.0–36.0)
MCV: 85.3 fL (ref 78.0–100.0)
Platelets: 232 10*3/uL (ref 150–400)
RBC: 4.95 MIL/uL (ref 3.87–5.11)
RDW: 14.1 % (ref 11.5–15.5)
WBC: 5.3 10*3/uL (ref 4.0–10.5)

## 2016-02-10 LAB — LIPASE, BLOOD: Lipase: 14 U/L (ref 11–51)

## 2016-02-10 LAB — HCG, QUANTITATIVE, PREGNANCY

## 2016-02-10 MED ORDER — KETOROLAC TROMETHAMINE 30 MG/ML IJ SOLN
30.0000 mg | Freq: Once | INTRAMUSCULAR | Status: AC
  Administered 2016-02-10: 30 mg via INTRAVENOUS
  Filled 2016-02-10: qty 1

## 2016-02-10 MED ORDER — ONDANSETRON 4 MG PO TBDP: ORAL_TABLET | ORAL | Status: DC

## 2016-02-10 MED ORDER — DICYCLOMINE HCL 20 MG PO TABS: ORAL_TABLET | ORAL | Status: DC

## 2016-02-10 MED ORDER — SODIUM CHLORIDE 0.9 % IV BOLUS (SEPSIS)
2000.0000 mL | Freq: Once | INTRAVENOUS | Status: AC
  Administered 2016-02-10: 2000 mL via INTRAVENOUS

## 2016-02-10 NOTE — Discharge Instructions (Signed)
Drink plenty of fluids and follow up as needed °

## 2016-02-10 NOTE — ED Notes (Signed)
Pt with watery diarrhea and abdominal pain since wed.

## 2016-02-10 NOTE — ED Notes (Signed)
Pt to use restroom.

## 2016-02-10 NOTE — ED Provider Notes (Signed)
CSN: [id number]     Arrival date & time 02/10/16  [id number] History   First MD Initiated Contact with Patient 02/10/16 [phone]     Chief Complaint  Patient presents with  . Abdominal Pain     (Consider location/radiation/quality/duration/timing/severity/associated sxs/prior Treatment) Patient is a 25 y.o. female presenting with abdominal pain. The history is provided by the patient (Patient complains of abdominal cramping back pain and diarrhea with nausea for 1-2 days no blood in stool).  Abdominal Pain Pain location:  Generalized Pain quality: aching   Pain radiates to:  Does not radiate Pain severity:  Moderate Onset quality:  Sudden Timing:  Constant Progression:  Waxing and waning Chronicity:  New Context: not alcohol use   Associated symptoms: no chest pain, no cough, no diarrhea, no fatigue and no hematuria     Past Medical History  Diagnosis Date  . Stye   . Infection     UTI  . Hypertension   . Obesity    Past Surgical History  Procedure Laterality Date  . No past surgeries     Family History  Problem Relation Age of Onset  . Diabetes Mother   . Diabetes Maternal Grandmother   . Diabetes Maternal Grandfather    Social History  Substance Use Topics  . Smoking status: Current Some Day Smoker -- 0.25 packs/day for 2 years    Types: Cigarettes    Last Attempt to Quit: 05/04/2013  . Smokeless tobacco: Never Used  . Alcohol Use: No     Comment: occ   OB History    Gravida Para Term Preterm AB TAB SAB Ectopic Multiple Living   1 1 1       1      Review of Systems  Constitutional: Negative for appetite change and fatigue.  HENT: Negative for congestion, ear discharge and sinus pressure.   Eyes: Negative for discharge.  Respiratory: Negative for cough.   Cardiovascular: Negative for chest pain.  Gastrointestinal: Positive for abdominal pain. Negative for diarrhea.  Genitourinary: Negative for frequency and hematuria.  Musculoskeletal: Negative for back pain.   Skin: Negative for rash.  Neurological: Negative for seizures and headaches.  Psychiatric/Behavioral: Negative for hallucinations.      Allergies  Review of patient's allergies indicates no known allergies.  Home Medications   Prior to Admission medications   Medication Sig Start Date End Date Taking? Authorizing Provider  amLODipine (NORVASC) 10 MG tablet Take 1 tablet (10 mg total) by mouth daily. Patient not taking: Reported on 08/26/2015 12/17/13   Reva Boresanya S Pratt, MD  carvedilol (COREG) 12.5 MG tablet Take 1 tablet (12.5 mg total) by mouth 2 (two) times daily with a meal. 01/22/16   Brock Badharles A Harper, MD  dicyclomine (BENTYL) 20 MG tablet Take one every 6-8 hours for abd cramps or pain 02/10/16   Bethann BerkshireJoseph Maryiah Olvey, MD  ibuprofen (ADVIL,MOTRIN) 600 MG tablet Take 1 tablet (600 mg total) by mouth every 6 (six) hours. Patient not taking: Reported on 01/22/2016 08/26/15   Ardith Darkaleb M Parker, MD  meloxicam (MOBIC) 15 MG tablet Take 1 tablet (15 mg total) by mouth daily. 01/25/16   Vivi BarrackMatthew R Wagoner, DPM  ondansetron (ZOFRAN ODT) 4 MG disintegrating tablet 4mg  ODT q4 hours prn nausea/vomit 02/10/16   Bethann BerkshireJoseph Hudsen Fei, MD   BP 150/99 mmHg  Pulse 62  Temp(Src) 98.1 F (36.7 C) (Oral)  Resp 18  SpO2 99% Physical Exam  Constitutional: She is oriented to person, place, and time. She appears well-developed.  HENT:  Head: Normocephalic.  Eyes: Conjunctivae and EOM are normal. No scleral icterus.  Neck: Neck supple. No thyromegaly present.  Cardiovascular: Normal rate and regular rhythm.  Exam reveals no gallop and no friction rub.   No murmur heard. Pulmonary/Chest: No stridor. She has no wheezes. She has no rales. She exhibits no tenderness.  Abdominal: She exhibits no distension. There is tenderness. There is no rebound.  Musculoskeletal: Normal range of motion. She exhibits no edema.  Lymphadenopathy:    She has no cervical adenopathy.  Neurological: She is oriented to person, place, and time. She  exhibits normal muscle tone. Coordination normal.  Skin: No rash noted. No erythema.  Psychiatric: She has a normal mood and affect. Her behavior is normal.    ED Course  Procedures (including critical care time) Labs Review Labs Reviewed  COMPREHENSIVE METABOLIC PANEL - Abnormal; Notable for the following:    CO2 20 (*)    All other components within normal limits  LIPASE, BLOOD  CBC  HCG, QUANTITATIVE, PREGNANCY  URINALYSIS, ROUTINE W REFLEX MICROSCOPIC (NOT AT [facility])    Imaging Review Dg Abd Acute W/chest  02/10/2016  CLINICAL DATA:  Abdominal pain. Low back and flank pain. Shortness of breath. Symptoms since 4 days ago. EXAM: DG ABDOMEN ACUTE W/ 1V CHEST COMPARISON:  None. FINDINGS: One-view chest shows normal heart and mediastinal shadows. The lungs are clear. No effusion. No free air under the diaphragm. Bowel gas pattern is normal without evidence of ileus, obstruction or free air. No abnormal calcifications or bony findings. IMPRESSION: Negative abdominal radiographs.  No acute cardiopulmonary disease. Electronically Signed   By: Paulina Fusi M.D.   On: 02/10/2016 11:08   I have personally reviewed and evaluated these images and lab results as part of my medical decision-making.   EKG Interpretation None      MDM   Final diagnoses:  Gastroenteritis    Labs unremarkable patient improved with Toradol and fluids. Diagnosis gastroenteritis patient given Bentyl and Zofran    Bethann Berkshire, MD 02/10/16 1246

## 2016-07-21 ENCOUNTER — Encounter (HOSPITAL_COMMUNITY): Payer: Self-pay | Admitting: Emergency Medicine

## 2016-07-21 ENCOUNTER — Emergency Department (HOSPITAL_COMMUNITY): Payer: Self-pay

## 2016-07-21 ENCOUNTER — Emergency Department (HOSPITAL_COMMUNITY)
Admission: EM | Admit: 2016-07-21 | Discharge: 2016-07-21 | Disposition: A | Discharge status: Home / Self Care | Payer: Self-pay | Attending: Emergency Medicine | Admitting: Emergency Medicine

## 2016-07-21 DIAGNOSIS — I10 Essential (primary) hypertension: Secondary | ICD-10-CM | POA: Insufficient documentation

## 2016-07-21 DIAGNOSIS — J40 Bronchitis, not specified as acute or chronic: Secondary | ICD-10-CM

## 2016-07-21 DIAGNOSIS — R52 Pain, unspecified: Secondary | ICD-10-CM

## 2016-07-21 DIAGNOSIS — F17210 Nicotine dependence, cigarettes, uncomplicated: Secondary | ICD-10-CM | POA: Insufficient documentation

## 2016-07-21 DIAGNOSIS — Z720 Tobacco use: Secondary | ICD-10-CM

## 2016-07-21 DIAGNOSIS — J069 Acute upper respiratory infection, unspecified: Secondary | ICD-10-CM

## 2016-07-21 DIAGNOSIS — R062 Wheezing: Secondary | ICD-10-CM

## 2016-07-21 DIAGNOSIS — B9789 Other viral agents as the cause of diseases classified elsewhere: Secondary | ICD-10-CM

## 2016-07-21 MED ORDER — PREDNISONE 20 MG PO TABS: ORAL_TABLET | ORAL | 0 refills | Status: DC

## 2016-07-21 MED ORDER — ALBUTEROL SULFATE HFA 108 (90 BASE) MCG/ACT IN AERS: 2.0000 | INHALATION_SPRAY | RESPIRATORY_TRACT | 0 refills | Status: DC | PRN

## 2016-07-21 MED ORDER — IPRATROPIUM BROMIDE 0.02 % IN SOLN
0.5000 mg | Freq: Once | RESPIRATORY_TRACT | Status: AC
  Administered 2016-07-21: 0.5 mg via RESPIRATORY_TRACT
  Filled 2016-07-21: qty 2.5

## 2016-07-21 MED ORDER — ALBUTEROL SULFATE (2.5 MG/3ML) 0.083% IN NEBU
5.0000 mg | INHALATION_SOLUTION | Freq: Once | RESPIRATORY_TRACT | Status: AC
Start: 2016-07-21 — End: 2016-07-21
  Administered 2016-07-21: 5 mg via RESPIRATORY_TRACT
  Filled 2016-07-21: qty 6

## 2016-07-21 MED ORDER — ALBUTEROL SULFATE HFA 108 (90 BASE) MCG/ACT IN AERS
1.0000 | INHALATION_SPRAY | RESPIRATORY_TRACT | Status: DC | PRN
  Administered 2016-07-21: 2 via RESPIRATORY_TRACT
  Filled 2016-07-21: qty 6.7

## 2016-07-21 MED ORDER — ALBUTEROL SULFATE (2.5 MG/3ML) 0.083% IN NEBU
5.0000 mg | INHALATION_SOLUTION | Freq: Once | RESPIRATORY_TRACT | Status: AC
  Administered 2016-07-21: 5 mg via RESPIRATORY_TRACT
  Filled 2016-07-21: qty 6

## 2016-07-21 MED ORDER — PREDNISONE 20 MG PO TABS
60.0000 mg | ORAL_TABLET | Freq: Once | ORAL | Status: AC
  Administered 2016-07-21: 60 mg via ORAL
  Filled 2016-07-21: qty 3

## 2016-07-21 NOTE — ED Triage Notes (Signed)
Cough cold diarrhea and aching since  Friday  Head feels stuffy , chest hurts when she coughs and has a congestion in chest

## 2016-07-21 NOTE — ED Provider Notes (Signed)
[facility]-EMERGENCY DEPT Provider Note   CSN: [id number] Arrival date & time: 07/21/16  4098     History   Chief Complaint Chief Complaint  Patient presents with  . Cough    HPI Kayla Bautista is a 25 y.o. female with a PMHx of HTN and obesity, who presents to the ED with complaints of cold symptoms 3 days. Her symptoms include dry cough, wheezing, chest congestion/tightness, shortness of breath, body aches, chills, green rhinorrhea, and one episode of nonbloody diarrhea this morning. Symptoms have not been improved with Alka-Seltzer. No known aggravating factors. She is a smoker. 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 She denies any history of asthma or COPD. Denies any fevers, sore throat, ear pain or drainage, leg swelling, recent travel/surgery/immobilization, family or personal history of DVT/PE, estrogen use, abdominal pain, nausea, vomiting, constipation, obstipation, melena, hematochezia, dysuria, hematuria, rashes, numbness, tingling, or focal weakness.    The history is provided by the patient and medical records. No language interpreter was used.  Cough  This is a new problem. The current episode started more than 2 days ago. The problem occurs constantly. The problem has been gradually worsening. The cough is non-productive. There has been no fever. Associated symptoms include chills, rhinorrhea, myalgias, shortness of breath and wheezing. Pertinent negatives include no ear pain and no sore throat. 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 The treatment provided no relief. She is a smoker. Her past medical history does not include COPD or asthma.    Past Medical History:  Diagnosis Date  . Hypertension   . Infection    UTI  . Obesity   . Stye     Patient Active Problem List   Diagnosis Date Noted  . BMI 45.0-49.9, adult (HCC) 03/03/2014  . Hypertension 03/03/2014  . Yeast infection involving the vagina and surrounding area 08/10/2013    Past  Surgical History:  Procedure Laterality Date  . NO PAST SURGERIES      OB History    Gravida Para Term Preterm AB Living   1 1 1     1    SAB TAB Ectopic Multiple Live Births           1       Home Medications    Prior to Admission medications   Medication Sig Start Date End Date Taking? Authorizing Provider  amLODipine (NORVASC) 10 MG tablet Take 1 tablet (10 mg total) by mouth daily. Patient not taking: Reported on 08/26/2015 12/17/13   Reva Bores, MD  carvedilol (COREG) 12.5 MG tablet Take 1 tablet (12.5 mg total) by mouth 2 (two) times daily with a meal. 01/22/16   Brock Bad, MD  dicyclomine (BENTYL) 20 MG tablet Take one every 6-8 hours for abd cramps or pain 02/10/16   Bethann Berkshire, MD  ibuprofen (ADVIL,MOTRIN) 600 MG tablet Take 1 tablet (600 mg total) by mouth every 6 (six) hours. Patient not taking: Reported on 01/22/2016 08/26/15   Ardith Dark, MD  meloxicam (MOBIC) 15 MG tablet Take 1 tablet (15 mg total) by mouth daily. 01/25/16   Vivi Barrack, DPM  ondansetron (ZOFRAN ODT) 4 MG disintegrating tablet 4mg  ODT q4 hours prn nausea/vomit 02/10/16   Bethann Berkshire, MD    Family History Family History  Problem Relation Age of Onset  . Diabetes Mother   . Diabetes Maternal Grandmother   . Diabetes Maternal Grandfather     Social History Social History  Substance Use Topics  .  Smoking status: Current Some Day Smoker    Packs/day: 0.25    Years: 2.00    Types: Cigarettes    Last attempt to quit: 05/04/2013  . Smokeless tobacco: Never Used  . Alcohol use No     Comment: occ     Allergies   Tylenol [acetaminophen]   Review of Systems Review of Systems  Constitutional: Positive for chills. Negative for fever.  HENT: Positive for congestion and rhinorrhea. Negative for ear discharge, ear pain and sore throat.   Respiratory: Positive for cough, chest tightness (congestion), shortness of breath and wheezing.   Cardiovascular: Negative for leg swelling.   Gastrointestinal: Positive for diarrhea. Negative for abdominal pain, blood in stool, constipation, nausea and vomiting.  Genitourinary: Negative for dysuria and hematuria.  Musculoskeletal: Positive for myalgias. Negative for arthralgias.  Skin: Negative for color change and rash.  Allergic/Immunologic: Negative for immunocompromised state.  Neurological: Negative for weakness and numbness.  Psychiatric/Behavioral: Negative for confusion.   10 Systems reviewed and are negative for acute change except as noted in the HPI.   Physical Exam Updated Vital Signs BP (!) 163/102   Pulse 103   Temp 98.7 F (37.1 C) (Oral)   Resp 16   Wt (!) 140.6 kg   SpO2 96%   BMI 47.14 kg/m  Re-check VS: BP 162/76 (BP Location: Left Arm)   Pulse 89   Temp 98.7 F (37.1 C) (Oral)   Resp 18   Wt (!) 140.6 kg   LMP 07/11/2016   SpO2 96%   BMI 47.14 kg/m    Physical Exam  Constitutional: She is oriented to person, place, and time. Vital signs are normal. She appears well-developed and well-nourished.  Non-toxic appearance. No distress.  Afebrile, nontoxic, NAD  HENT:  Head: Normocephalic and atraumatic.  Nose: Mucosal edema and rhinorrhea present.  Mouth/Throat: Uvula is midline, oropharynx is clear and moist and mucous membranes are normal. No trismus in the jaw. No uvula swelling. Tonsils are 0 on the right. Tonsils are 0 on the left. No tonsillar exudate.  Nose with mild mucosal edema and rhinorrhea. Oropharynx clear and moist, without uvular swelling or deviation, no trismus or drooling, no tonsillar swelling or erythema, no exudates.    Eyes: Conjunctivae and EOM are normal. Right eye exhibits no discharge. Left eye exhibits no discharge.  Neck: Normal range of motion. Neck supple.  Cardiovascular: Normal rate, regular rhythm, normal heart sounds and intact distal pulses.  Exam reveals no gallop and no friction rub.   No murmur heard. RRR, nl s1/s2, no m/r/g, distal pulses intact, no pedal  edema   Pulmonary/Chest: Effort normal. No respiratory distress. She has no decreased breath sounds. She has wheezes. She has rhonchi. She has no rales. She exhibits tenderness. She exhibits no crepitus, no deformity and no retraction.  Scattered rhonchi in all lung fields with some faint expiratory wheezing throughout, no rales, no hypoxia or increased WOB, speaking in full sentences, SpO2 96% on RA  Chest wall with mild anterior TTP, without crepitus, deformities, or retractions   Abdominal: Soft. Normal appearance and bowel sounds are normal. She exhibits no distension. There is no tenderness. There is no rigidity, no rebound, no guarding, no CVA tenderness, no tenderness at McBurney's point and negative Murphy's sign.  Musculoskeletal: Normal range of motion.  MAE x4 Strength and sensation grossly intact Distal pulses intact Gait steady No pedal edema  Neurological: She is alert and oriented to person, place, and time. She has normal strength.  No sensory deficit.  Skin: Skin is warm, dry and intact. No rash noted.  Psychiatric: She has a normal mood and affect.  Nursing note and vitals reviewed.    ED Treatments / Results  Labs (all labs ordered are listed, but only abnormal results are displayed) Labs Reviewed - No data to display  EKG  EKG Interpretation None       Radiology Dg Chest 2 View  Result Date: 07/21/2016 CLINICAL DATA:  Cough, congestion, chest pain EXAM: CHEST  2 VIEW COMPARISON:  02/10/2016 FINDINGS: The heart size and mediastinal contours are within normal limits. Both lungs are clear. The visualized skeletal structures are unremarkable. IMPRESSION: No active cardiopulmonary disease. Electronically Signed   By: Elige Ko   On: 07/21/2016 09:59    Procedures Procedures (including critical care time)  Medications Ordered in ED Medications  albuterol (PROVENTIL HFA;VENTOLIN HFA) 108 (90 Base) MCG/ACT inhaler 1-2 puff (not administered)  predniSONE  (DELTASONE) tablet 60 mg (60 mg Oral Given 07/21/16 0915)  albuterol (PROVENTIL) (2.5 MG/3ML) 0.083% nebulizer solution 5 mg (5 mg Nebulization Given 07/21/16 0915)  ipratropium (ATROVENT) nebulizer solution 0.5 mg (0.5 mg Nebulization Given 07/21/16 0915)  albuterol (PROVENTIL) (2.5 MG/3ML) 0.083% nebulizer solution 5 mg (5 mg Nebulization Given 07/21/16 1041)  ipratropium (ATROVENT) nebulizer solution 0.5 mg (0.5 mg Nebulization Given 07/21/16 1041)     Initial Impression / Assessment and Plan / ED Course  I have reviewed the triage vital signs and the nursing notes.  Pertinent labs & imaging results that were available during my care of the patient were reviewed by me and considered in my medical decision making (see chart for details).  Clinical Course    25 y.o. female here with cough, congestion, wheezing, and cold symptoms x3 days; c/o SOB and CP but mostly related to coughing/congestion. On exam, wheezing and some scattered rhonchi throughout lungs; chest wall tender to palpation. Doubt PE, although they documented HR >100 but on exam she was not tachycardic and she has no other RFs for this, and with obvious other etiology of symptoms it makes PE less likely. Will get CXR and give duoneb and prednisone, then reassess shortly.   10:20 AM  CXR neg. Pt feeling much better after breathing tx, breath sounds improved but still some faint wheezing in lower fields, will repeat breathing tx here and then likely send home with inhaler and pred x4 days for bronchitis; doubt need for abx. Will reassess after second duoneb tx.  11:33 AM Lung sounds improved after breathing tx, pt ready to go home. Discussed use of OTC meds for symptom control, and f/up with her PCP in 1wk for recheck of symptoms. Inhaler given here prior to discharge. Rx for prednisone and inhaler given. Smoking cessation advised. I explained the diagnosis and have given explicit precautions to return to the ER including for any  other new or worsening symptoms. The patient understands and accepts the medical plan as it's been dictated and I have answered their questions. Discharge instructions concerning home care and prescriptions have been given. The patient is STABLE and is discharged to home in good condition.   Final Clinical Impressions(s) / ED Diagnoses   Final diagnoses:  Viral URI with cough  Tobacco user  Bronchitis  Wheezing  Body aches    New Prescriptions New Prescriptions   ALBUTEROL (PROVENTIL HFA;VENTOLIN HFA) 108 (90 BASE) MCG/ACT INHALER    Inhale 2 puffs into the lungs every 4 (four) hours as needed for wheezing or  shortness of breath (cough).   PREDNISONE (DELTASONE) 20 MG TABLET    3 tabs po daily x 4 days starting 07/22/16       Allen Derry, PA-C 07/21/16 1135    Laurence Spates, MD 07/23/16 [phone]

## 2016-07-21 NOTE — ED Notes (Signed)
Declined W/C at D/C and was escorted to lobby by RN. 

## 2016-07-21 NOTE — Discharge Instructions (Signed)
Continue to stay well-hydrated. Gargle warm salt water and spit it out. Use chloraseptic spray as needed for sore throat. Continue to alternate between Tylenol and Ibuprofen for pain or fever. Use Mucinex for cough suppression/expectoration of mucus. Use netipot and flonase to help with nasal congestion. May consider over-the-counter Benadryl or other antihistamine to decrease secretions and for symptom relief.  Use inhaler as directed, as needed for cough/chest congestion/wheezing/shortness of breath. Take prednisone as directed starting tomorrow since you received your first dose here. Followup with your primary care doctor in 5-7 days for recheck of ongoing symptoms. Return to emergency department for emergent changing or worsening of symptoms.

## 2016-11-24 ENCOUNTER — Ambulatory Visit (INDEPENDENT_AMBULATORY_CARE_PROVIDER_SITE_OTHER): Payer: Medicaid Other | Admitting: Obstetrics and Gynecology

## 2016-11-24 ENCOUNTER — Encounter: Payer: Self-pay | Admitting: Obstetrics and Gynecology

## 2016-11-24 VITALS — BP 166/102 | HR 84 | Wt 295.5 lb

## 2016-11-24 DIAGNOSIS — Z309 Encounter for contraceptive management, unspecified: Secondary | ICD-10-CM | POA: Diagnosis not present

## 2016-11-24 DIAGNOSIS — Z3046 Encounter for surveillance of implantable subdermal contraceptive: Secondary | ICD-10-CM

## 2016-11-24 NOTE — Progress Notes (Signed)
Obstetrics and Gynecology Visit Established Patient Evaluation  Appointment Date: 11/24/2016  [facility]  Primary Care Provider: No PCP Per Patient  Referring Provider: No ref. provider found  Chief Complaint: birth control discussion  History of Present Illness: Kayla Bautista is a 26 y.o. African-American G1P1001 (Patient's last menstrual period was 11/05/2016 (approximate).), seen for the above chief complaint. Her past medical history is significant for BMI 44, HTN, poor patient compliance  Patient seen on in late April 2017 for her annual with Dr. Clearance CootsHarper. She has had the nexplanon since 02/2014. She states she has a period every month and lasts a few days and not particularly heavy or painful.  Patient is here for an annual and has FP Medicaid but in talking to the patient it sounds like she may have been scheduled for a regular office visit for contraception discussion.   She denies any vaginal discharge, itching, or GYN problems. She also denies any chest pain, SOB, HA, visual s/s.   Review of Systems: as noted in the History of Present Illness.  Past Medical History:  Past Medical History:  Diagnosis Date  . Hypertension   . Infection    UTI  . Obesity   . Stye     Past Surgical History:  Past Surgical History:  Procedure Laterality Date  . NO PAST SURGERIES      Past Obstetrical History:  OB History  Gravida Para Term Preterm AB Living  1 1 1     1   SAB TAB Ectopic Multiple Live Births          1    # Outcome Date GA Lbr Len/2nd Weight Sex Delivery Anes PTL Lv  1 Term 12/15/13 [id number] 24:53 / 00:17 6 lb 0.8 oz (2.744 kg) F Vag-Spont EPI  LIV      Past Gynecological History: As per HPI. 04/2013: pap negative.   Social History:  Social History   Social History  . Marital status: Single    Spouse name: N/A  . Number of children: N/A  . Years of education: N/A   Occupational History  . Not on file.   Social History  Main Topics  . Smoking status: Current Some Day Smoker    Packs/day: 0.25    Years: 2.00    Types: Cigarettes    Last attempt to quit: 05/04/2013  . Smokeless tobacco: Never Used  . Alcohol use No     Comment: occ  . Drug use: No  . Sexual activity: Yes    Birth control/ protection: None   Other Topics Concern  . Not on file   Social History Narrative  . No narrative on file    Family History:  Family History  Problem Relation Age of Onset  . Diabetes Mother   . Diabetes Maternal Grandmother   . Diabetes Maternal Grandfather     Medications Ms. Hollice EspyGibson had no medications administered during this visit. Current Outpatient Prescriptions  Medication Sig Dispense Refill  . albuterol (PROVENTIL HFA;VENTOLIN HFA) 108 (90 Base) MCG/ACT inhaler Inhale 2 puffs into the lungs every 4 (four) hours as needed for wheezing or shortness of breath (cough). 1 Inhaler 0  . amLODipine (NORVASC) 10 MG tablet Take 1 tablet (10 mg total) by mouth daily. (Patient not taking: Reported on 07/21/2016) 60 tablet 0  . aspirin-sod bicarb-citric acid (ALKA-SELTZER) 325 MG TBEF tablet Take 325 mg by mouth every 6 (six) hours as needed (cold symtoms).    . carvedilol (  COREG) 12.5 MG tablet Take 1 tablet (12.5 mg total) by mouth 2 (two) times daily with a meal. (Patient not taking: Reported on 07/21/2016) 60 tablet 11  . dicyclomine (BENTYL) 20 MG tablet Take one every 6-8 hours for abd cramps or pain (Patient not taking: Reported on 07/21/2016) 20 tablet 0  . ibuprofen (ADVIL,MOTRIN) 600 MG tablet Take 1 tablet (600 mg total) by mouth every 6 (six) hours. (Patient not taking: Reported on 07/21/2016) 30 tablet 0  . meloxicam (MOBIC) 15 MG tablet Take 1 tablet (15 mg total) by mouth daily. (Patient not taking: Reported on 07/21/2016) 30 tablet 2  . ondansetron (ZOFRAN ODT) 4 MG disintegrating tablet 4mg  ODT q4 hours prn nausea/vomit (Patient not taking: Reported on 07/21/2016) 10 tablet 0  . predniSONE  (DELTASONE) 20 MG tablet 3 tabs po daily x 4 days starting 07/22/16 (Patient not taking: Reported on 11/24/2016) 12 tablet 0   Current Facility-Administered Medications  Medication Dose Route Frequency Provider Last Rate Last Dose  . triamterene-hydrochlorothiazide (DYAZIDE) 37.5-25 MG per capsule 1 capsule  1 capsule Oral Q breakfast Brock Bad, MD        Allergies Tylenol [acetaminophen]   Physical Exam:  BP (!) 166/102   Pulse 84   Wt 295 lb 8 oz (134 kg)   LMP 11/05/2016 (Approximate)   BMI 44.93 kg/m  Body mass index is 44.93 kg/m. General appearance: Well nourished, well developed female in no acute distress.  RUE: nexplanon palpated  Laboratory: none  Radiology: none  Assessment: pt stable  Plan:  In initially discussion with patient, she was wanting to get the nexplanon taken out, but then after talking with her, she is amenable to keep it and getting another one. She states that it was placed in March but the note state 02/2014, so I told her that it's too early for removal and reinsertion. I told her that with [facility] Medicaid that we have to do an annual and pap qyear, which needs to be done in April (a year from her last GYN visit) and I told her we can set her up for a 2 month RTC for nexplanon removal and reinsertion.  Patient upset b/c she thought she was going to have all that done today and she's wondering why she was scheduled for a visit today.   D/w pt re: HTN and she doesn't have a PCP but is adamantly opposed to starting tx. D/w her risks associated with untreated HTN and pt acknowledges but doesn't want tx.   RTC 109m for annual and pap  Cornelia Copa MD Attending [facility])

## 2017-01-16 ENCOUNTER — Ambulatory Visit (HOSPITAL_COMMUNITY)
Admission: EM | Admit: 2017-01-16 | Discharge: 2017-01-16 | Disposition: A | Discharge status: Home / Self Care | Payer: Medicaid Other | Attending: Internal Medicine | Admitting: Internal Medicine

## 2017-01-16 ENCOUNTER — Encounter (HOSPITAL_COMMUNITY): Payer: Self-pay | Admitting: Emergency Medicine

## 2017-01-16 DIAGNOSIS — R0982 Postnasal drip: Secondary | ICD-10-CM

## 2017-01-16 DIAGNOSIS — R05 Cough: Secondary | ICD-10-CM

## 2017-01-16 DIAGNOSIS — J029 Acute pharyngitis, unspecified: Secondary | ICD-10-CM

## 2017-01-16 NOTE — ED Triage Notes (Addendum)
The patient presented to the [facility] with a complaint of a sore throat and a cough with congestion x 5 days. The patient denied any fever at home.

## 2017-01-16 NOTE — ED Provider Notes (Signed)
CSN: [id number]     Arrival date & time 01/16/17  1527 History   First MD Initiated Contact with Patient 01/16/17 1621     Chief Complaint  Patient presents with  . Cough  . Sore Throat   (Consider location/radiation/quality/duration/timing/severity/associated sxs/prior Treatment) 26 year old female complaining of sore throat for 3 days. Primarily the left side. Denies fever or chills. Previously she had PND but does not believe she has that now. She denies other symptoms. She is observed sniffling, clearing her throat and with laryngitis.      Past Medical History:  Diagnosis Date  . Hypertension   . Infection    UTI  . Obesity   . Stye    Past Surgical History:  Procedure Laterality Date  . NO PAST SURGERIES     Family History  Problem Relation Age of Onset  . Diabetes Mother   . Diabetes Maternal Grandmother   . Diabetes Maternal Grandfather    Social History  Substance Use Topics  . Smoking status: Current Some Day Smoker    Packs/day: 0.25    Years: 2.00    Types: Cigarettes    Last attempt to quit: 05/04/2013  . Smokeless tobacco: Never Used  . Alcohol use No     Comment: occ   OB History    Gravida Para Term Preterm AB Living   SAB TAB Ectopic Multiple Live Births           1     Review of Systems  Constitutional: Negative for activity change and fever.  HENT: Positive for sore throat.   Respiratory: Negative.   Gastrointestinal: Negative.   All other systems reviewed and are negative.   Allergies  Tylenol [acetaminophen]  Home Medications   Prior to Admission medications   Not on File   Meds Ordered and Administered this Visit  Medications - No data to display  BP (!) 172/98 (BP Location: Right Wrist)   Pulse 97   Temp 98.1 F (36.7 C) (Oral)   Resp 20   SpO2 100%  No data found.   Physical Exam  Constitutional: She is oriented to person, place, and time. She appears well-developed and well-nourished. No distress.   HENT:  Mouth/Throat: No oropharyngeal exudate.  Oropharynx with minor erythema much cobblestoning and moderate amount of clear PND.  Eyes: EOM are normal.  Neck: Normal range of motion. Neck supple.  One solitary one half centimeter node on each side.  Cardiovascular: Normal rate, regular rhythm, normal heart sounds and intact distal pulses.   Pulmonary/Chest: Effort normal and breath sounds normal. No respiratory distress.  Musculoskeletal: Normal range of motion. She exhibits no edema.  Neurological: She is alert and oriented to person, place, and time.  Skin: Skin is warm and dry.  Nursing note and vitals reviewed.   Urgent Care Course     Procedures (including critical care time)  Labs Review Labs Reviewed - No data to display  Imaging Review No results found.   Visual Acuity Review  Right Eye Distance:   Left Eye Distance:   Bilateral Distance:    Right Eye Near:   Left Eye Near:    Bilateral Near:         MDM   1. Sore throat   2. PND (post-nasal drip)    Allegra or Zyrtec daily as needed for drainage and runny nose. For stronger antihistamine may take Chlor-Trimeton 2 to 4 mg every  4 to 6 hours, may cause drowsiness. Saline nasal spray used frequently. Ibuprofen 600 mg every 6 hours as needed for pain, discomfort or fever. Drink plenty of fluids and stay well-hydrated. Flonase or Rhinocort nasal spray daily     Hayden Rasmussen, NP 01/16/17 1642

## 2017-01-16 NOTE — Discharge Instructions (Signed)
The visualized reason for your sore throat is the drainage in the back of your throat coming from your sinuses. This causes irritation and inflammation of the throat which causes pain. The primary treatment is attempting to stop or slow down the drainage with an antihistamine. The combination of sniffles, drainage in the back of the throat, the cobblestoning, the change in voice is likely due to environmental allergies. Cepacol lozenges can help a sore throat pain. Allegra or Zyrtec daily as needed for drainage and runny nose. For stronger antihistamine may take Chlor-Trimeton 2 to 4 mg every 4 to 6 hours, may cause drowsiness. Saline nasal spray used frequently. Ibuprofen 600 mg every 6 hours as needed for pain, discomfort or fever. Drink plenty of fluids and stay well-hydrated. Flonase or Rhinocort nasal spray daily

## 2017-01-22 ENCOUNTER — Other Ambulatory Visit (HOSPITAL_COMMUNITY)
Admission: RE | Admit: 2017-01-22 | Discharge: 2017-01-22 | Disposition: A | Discharge status: Home / Self Care | Payer: Medicaid Other | Source: Ambulatory Visit | Attending: Obstetrics & Gynecology | Admitting: Obstetrics & Gynecology

## 2017-01-22 ENCOUNTER — Ambulatory Visit (INDEPENDENT_AMBULATORY_CARE_PROVIDER_SITE_OTHER): Payer: Medicaid Other | Admitting: Obstetrics & Gynecology

## 2017-01-22 ENCOUNTER — Encounter: Payer: Self-pay | Admitting: Obstetrics & Gynecology

## 2017-01-22 VITALS — BP 166/93 | HR 96 | Ht 63.0 in | Wt 303.3 lb

## 2017-01-22 DIAGNOSIS — Z113 Encounter for screening for infections with a predominantly sexual mode of transmission: Secondary | ICD-10-CM

## 2017-01-22 DIAGNOSIS — Z0000 Encounter for general adult medical examination without abnormal findings: Secondary | ICD-10-CM | POA: Diagnosis present

## 2017-01-22 DIAGNOSIS — Z01419 Encounter for gynecological examination (general) (routine) without abnormal findings: Secondary | ICD-10-CM

## 2017-01-22 DIAGNOSIS — Z124 Encounter for screening for malignant neoplasm of cervix: Secondary | ICD-10-CM

## 2017-01-22 NOTE — Progress Notes (Signed)
Subjective:    Kayla Bautista is a 26 y.o. S AA P1 (3 yo daughter)  female who presents for an annual exam. The patient has no complaints today. The patient is sexually active. GYN screening history: last pap: was normal. The patient wears seatbelts: yes. The patient participates in regular exercise: no. Has the patient ever been transfused or tattooed?: no. The patient reports that there is not domestic violence in her life.   Menstrual History: OB History    Gravida Para Term Preterm AB Living   SAB TAB Ectopic Multiple Live Births           1      Menarche age: 89 No LMP recorded. Patient has had an implant.    The following portions of the patient's history were reviewed and updated as appropriate: allergies, current medications, past family history, past medical history, past social history, past surgical history and problem list.  Review of Systems Pertinent items are noted in HPI.   FH- no breast/gyn/colon cancer Works at [employer] Has nexplanon, periods not regular, LMP 2/18 Occasional dyspareunia Same partner for 8 years, lives together   Objective:    BP (!) 166/93   Pulse 96   Ht  (1.6 m)   Wt (!) 303 lb 4.8 oz (137.6 kg)   BMI 53.73 kg/m   General Appearance:    Alert, cooperative, no distress, appears stated age  Head:    Normocephalic, without obvious abnormality, atraumatic  Eyes:    PERRL, conjunctiva/corneas clear, EOM's intact, fundi    benign, both eyes  Ears:    Normal TM's and external ear canals, both ears  Nose:   Nares normal, septum midline, mucosa normal, no drainage    or sinus tenderness  Throat:   Lips, mucosa, and tongue normal; teeth and gums normal  Neck:   Supple, symmetrical, trachea midline, no adenopathy;    thyroid:  no enlargement/tenderness/nodules; no carotid   bruit or JVD  Back:     Symmetric, no curvature, ROM normal, no CVA tenderness  Lungs:     Clear to auscultation bilaterally, respirations  unlabored  Chest Wall:    No tenderness or deformity   Heart:    Regular rate and rhythm, S1 and S2 normal, no murmur, rub   or gallop  Breast Exam:    No tenderness, masses, or nipple abnormality  Abdomen:     Soft, non-tender, bowel sounds active all four quadrants,    no masses, no organomegaly  Genitalia:    Normal female without lesion, discharge or tenderness     Extremities:   Extremities normal, atraumatic, no cyanosis or edema  Pulses:   2+ and symmetric all extremities  Skin:   Skin color, texture, turgor normal, no rashes or lesions  Lymph nodes:   Cervical, supraclavicular, and axillary nodes normal  Neurologic:   CNII-XII intact, normal strength, sensation and reflexes    throughout  .    Assessment:    Healthy female exam.    Plan:     Thin prep Pap smear.   Cervical cultures RTC 6/18 for new Nexplanon

## 2017-01-22 NOTE — Progress Notes (Signed)
Subjective:    Kayla Bautista is a 26 y.o. S  AA P1 female who presents for an annual exam. The patient has no complaints today. The patient is sexually active. GYN screening history: last pap: was normal. The patient wears seatbelts: yes. The patient participates in regular exercise: no. Has the patient ever been transfused or tattooed?: no. The patient reports that there is not domestic violence in her life.   Menstrual History: OB History    Gravida Para Term Preterm AB Living   SAB TAB Ectopic Multiple Live Births           1      Menarche age: 20 No LMP recorded. Patient has had an implant.    The following portions of the patient's history were reviewed and updated as appropriate: allergies, current medications, past family history, past medical history, past social history, past surgical history and problem list.  Review of Systems Pertinent items noted in HPI and remainder of comprehensive ROS otherwise negative.   Works for [employer] No FH of breast/gyn/colon cancer Has Nexplanon, skips some periods Denies dyspareunia With same partner for 8 years, lives together   Objective:    BP (!) 166/93   Pulse 96   Ht  (1.6 m)   Wt (!) 303 lb 4.8 oz (137.6 kg)   BMI 53.73 kg/m   General Appearance:    Alert, cooperative, no distress, appears stated age  Head:    Normocephalic, without obvious abnormality, atraumatic  Eyes:    PERRL, conjunctiva/corneas clear, EOM's intact, fundi    benign, both eyes  Ears:    Normal TM's and external ear canals, both ears  Nose:   Nares normal, septum midline, mucosa normal, no drainage    or sinus tenderness  Throat:   Lips, mucosa, and tongue normal; teeth and gums normal  Neck:   Supple, symmetrical, trachea midline, no adenopathy;    thyroid:  no enlargement/tenderness/nodules; no carotid   bruit or JVD  Back:     Symmetric, no curvature, ROM normal, no CVA tenderness  Lungs:     Clear to auscultation  bilaterally, respirations unlabored  Chest Wall:    No tenderness or deformity   Heart:    Regular rate and rhythm, S1 and S2 normal, no murmur, rub   or gallop  Breast Exam:    No tenderness, masses, or nipple abnormality  Abdomen:     Soft, non-tender, bowel sounds active all four quadrants,    no masses, no organomegaly  Genitalia:    Normal female without lesion, discharge or tenderness, NSSA, NT, no adnexal masses palpable and no tenderness     Extremities:   Extremities normal, atraumatic, no cyanosis or edema  Pulses:   2+ and symmetric all extremities  Skin:   Skin color, texture, turgor normal, no rashes or lesions  Lymph nodes:   Cervical, supraclavicular, and axillary nodes normal  Neurologic:   CNII-XII intact, normal strength, sensation and reflexes    throughout  .    Assessment:    Healthy female exam.    Plan:     Thin prep Pap smear. with cervical cultures RTC for new Nexplanon in 2 months

## 2017-01-23 LAB — CYTOLOGY - PAP
Chlamydia: NEGATIVE
Diagnosis: NEGATIVE
Neisseria Gonorrhea: NEGATIVE

## 2017-03-24 ENCOUNTER — Encounter: Payer: Self-pay | Admitting: Obstetrics & Gynecology

## 2017-03-24 ENCOUNTER — Ambulatory Visit (INDEPENDENT_AMBULATORY_CARE_PROVIDER_SITE_OTHER): Payer: Medicaid Other | Admitting: Obstetrics & Gynecology

## 2017-03-24 DIAGNOSIS — Z30017 Encounter for initial prescription of implantable subdermal contraceptive: Secondary | ICD-10-CM

## 2017-03-24 DIAGNOSIS — Z3046 Encounter for surveillance of implantable subdermal contraceptive: Secondary | ICD-10-CM | POA: Diagnosis present

## 2017-03-24 MED ORDER — ETONOGESTREL 68 MG ~~LOC~~ IMPL
68.0000 mg | DRUG_IMPLANT | Freq: Once | SUBCUTANEOUS | Status: AC
  Administered 2017-03-24: 68 mg via SUBCUTANEOUS

## 2017-03-24 NOTE — Addendum Note (Signed)
Addended by: Lorelle GibbsWILSON, Herman Mell L on: 03/24/2017 10:54 AM   Modules accepted: Orders

## 2017-03-24 NOTE — Progress Notes (Signed)
   Subjective:    Patient ID: Kayla CashMichelle Missey, female    DOB: 03/04/1991, 26 y.o.   MRN: [id number]  HPI 26 yo S AA P1 here for replacement of her newly expiring Nexplanon. She does have some periods.   Review of Systems     Objective:   Physical Exam Pleasant morbidly obese BFNAD Breathing, conversing, and ambulating normally Consent was signed and time out was done. Her right arm was prepped with betadine after establishing the position of the Nexplanon. The area was infiltrated with 2 cc of 1% lidocaine. A small incision was made and the intact rod was easily removed and noted to be intact.  I then placed a new Nexplanon according to standard of care. A steristrip was placed and her arm was noted to be hemostatic. It was bandaged.  She tolerated the procedure well.       Assessment & Plan:   Contraception- Nexplanon

## 2017-03-25 ENCOUNTER — Telehealth: Payer: Self-pay | Admitting: *Deleted

## 2017-03-25 DIAGNOSIS — I10 Essential (primary) hypertension: Secondary | ICD-10-CM

## 2017-03-25 DIAGNOSIS — Z7689 Persons encountering health services in other specified circumstances: Secondary | ICD-10-CM

## 2017-03-25 NOTE — Telephone Encounter (Signed)
Marcelino DusterMichelle called today and left a message that she came in yesterday and had a nexplanon removed and a new one inserted. States today around 10am noticied bumps on her arm and tingling in her toes/feet.    I called Marcelino DusterMichelle back and she states she has noticed the bumps are starting to go away. States she still has on her dressing and knows she should leave in on 24-48 hours; but states it has rolled a little. She states she has had some itching occasionally. We discussed it could be an allergic reaction to either betadine, lidocaine, dressing material and I recommended she take over the counter benadryl following package directions- call us back if it doesn't get better or go to urgent care. She states she has had the tingling before when she stands a lot. We discussed that likely that is not related to getting her nexplanon and is coincidential but could be caused by several other things. She states her feet have good color. Due to the tingling in her feet and history hypertension I advised her to see her pcp- she states she doesn't have one- I advised her she should get a pcp now.  I advised her to go to an urgent care if her tingling worsened before she gets in to see a pcp. We [facility] as [facility] self [facility]. I also sent a referral to [facility]. She voices understanding.

## 2017-12-08 ENCOUNTER — Encounter (HOSPITAL_COMMUNITY): Payer: Self-pay | Admitting: Emergency Medicine

## 2017-12-08 ENCOUNTER — Emergency Department (HOSPITAL_COMMUNITY)
Admission: EM | Admit: 2017-12-08 | Discharge: 2017-12-08 | Disposition: A | Discharge status: Home / Self Care | Payer: BC Managed Care – PPO | Attending: Emergency Medicine | Admitting: Emergency Medicine

## 2017-12-08 DIAGNOSIS — R112 Nausea with vomiting, unspecified: Secondary | ICD-10-CM

## 2017-12-08 DIAGNOSIS — E860 Dehydration: Secondary | ICD-10-CM | POA: Insufficient documentation

## 2017-12-08 DIAGNOSIS — Z79899 Other long term (current) drug therapy: Secondary | ICD-10-CM | POA: Insufficient documentation

## 2017-12-08 DIAGNOSIS — R197 Diarrhea, unspecified: Secondary | ICD-10-CM

## 2017-12-08 DIAGNOSIS — I10 Essential (primary) hypertension: Secondary | ICD-10-CM | POA: Insufficient documentation

## 2017-12-08 DIAGNOSIS — F17210 Nicotine dependence, cigarettes, uncomplicated: Secondary | ICD-10-CM | POA: Insufficient documentation

## 2017-12-08 LAB — COMPREHENSIVE METABOLIC PANEL
ALBUMIN: 3.8 g/dL (ref 3.5–5.0)
ALK PHOS: 58 U/L (ref 38–126)
ALT: 19 U/L (ref 14–54)
ANION GAP: 10 (ref 5–15)
AST: 16 U/L (ref 15–41)
BILIRUBIN TOTAL: 0.9 mg/dL (ref 0.3–1.2)
BUN: 8 mg/dL (ref 6–20)
CALCIUM: 9.1 mg/dL (ref 8.9–10.3)
CO2: 21 mmol/L — ABNORMAL LOW (ref 22–32)
Chloride: 105 mmol/L (ref 101–111)
Creatinine, Ser: 0.72 mg/dL (ref 0.44–1.00)
GLUCOSE: 126 mg/dL — AB (ref 65–99)
POTASSIUM: 3.7 mmol/L (ref 3.5–5.1)
Sodium: 136 mmol/L (ref 135–145)
TOTAL PROTEIN: 7.5 g/dL (ref 6.5–8.1)

## 2017-12-08 LAB — I-STAT BETA HCG BLOOD, ED (MC, WL, AP ONLY)

## 2017-12-08 LAB — URINALYSIS, ROUTINE W REFLEX MICROSCOPIC
BILIRUBIN URINE: NEGATIVE
Glucose, UA: NEGATIVE mg/dL
KETONES UR: 15 mg/dL — AB
LEUKOCYTES UA: NEGATIVE
NITRITE: NEGATIVE
Protein, ur: NEGATIVE mg/dL
SPECIFIC GRAVITY, URINE: 1.015 (ref 1.005–1.030)
pH: 7 (ref 5.0–8.0)

## 2017-12-08 LAB — CBC
HEMATOCRIT: 40 % (ref 36.0–46.0)
HEMOGLOBIN: 13.1 g/dL (ref 12.0–15.0)
MCH: 28.2 pg (ref 26.0–34.0)
MCHC: 32.8 g/dL (ref 30.0–36.0)
MCV: 86 fL (ref 78.0–100.0)
Platelets: 241 10*3/uL (ref 150–400)
RBC: 4.65 MIL/uL (ref 3.87–5.11)
RDW: 14.3 % (ref 11.5–15.5)
WBC: 6.9 10*3/uL (ref 4.0–10.5)

## 2017-12-08 LAB — URINALYSIS, MICROSCOPIC (REFLEX)

## 2017-12-08 LAB — LIPASE, BLOOD: Lipase: 20 U/L (ref 11–51)

## 2017-12-08 MED ORDER — MORPHINE SULFATE (PF) 4 MG/ML IV SOLN
4.0000 mg | Freq: Once | INTRAVENOUS | Status: AC
  Administered 2017-12-08: 4 mg via INTRAVENOUS
  Filled 2017-12-08: qty 1

## 2017-12-08 MED ORDER — ONDANSETRON HCL 4 MG/2ML IJ SOLN
4.0000 mg | Freq: Once | INTRAMUSCULAR | Status: AC
  Administered 2017-12-08: 4 mg via INTRAVENOUS
  Filled 2017-12-08: qty 2

## 2017-12-08 MED ORDER — TRAMADOL HCL 50 MG PO TABS: 50.0000 mg | ORAL_TABLET | Freq: Four times a day (QID) | ORAL | 0 refills | Status: DC | PRN

## 2017-12-08 MED ORDER — ONDANSETRON 4 MG PO TBDP: 4.0000 mg | ORAL_TABLET | Freq: Three times a day (TID) | ORAL | 0 refills | Status: DC | PRN

## 2017-12-08 MED ORDER — FAMOTIDINE IN NACL 20-0.9 MG/50ML-% IV SOLN
20.0000 mg | Freq: Once | INTRAVENOUS | Status: AC
  Administered 2017-12-08: 20 mg via INTRAVENOUS
  Filled 2017-12-08: qty 50

## 2017-12-08 MED ORDER — SODIUM CHLORIDE 0.9 % IV BOLUS (SEPSIS)
1000.0000 mL | Freq: Once | INTRAVENOUS | Status: AC
  Administered 2017-12-08: 1000 mL via INTRAVENOUS

## 2017-12-08 NOTE — ED Triage Notes (Signed)
Pt reports abdominal pain, diarrhea and emesis since yesterday at 7PM. Reports generalized pain, unable to keep PO fluids down

## 2017-12-08 NOTE — ED Provider Notes (Signed)
[facility] Provider Note   CSN: [id number] Arrival date & time: 12/08/17  [id number]     History   Chief Complaint Chief Complaint  Patient presents with  . Emesis  . Diarrhea    HPI Kayla Bautista is a 27 y.o. female.  Pt presents to the ED today with n/v/d.  The pt said she ate [employer] last night around 1900.  Since then, she has had n/v/d.  She has been unable to keep down any fluids.  She has diffuse abd pain as well.  She denies f/c.      Past Medical History:  Diagnosis Date  . Hypertension   . Infection    UTI  . Obesity   . Stye     Patient Active Problem List   Diagnosis Date Noted  . BMI 45.0-49.9, adult (HCC) 03/03/2014  . Hypertension 03/03/2014  . Yeast infection involving the vagina and surrounding area 08/10/2013    Past Surgical History:  Procedure Laterality Date  . NO PAST SURGERIES      OB History    Gravida Para Term Preterm AB Living   1 1 1     1    SAB TAB Ectopic Multiple Live Births           1       Home Medications    Prior to Admission medications   Medication Sig Start Date End Date Taking? Authorizing Provider  aspirin-sod bicarb-citric acid (ALKA-SELTZER) 325 MG TBEF tablet Take 325 mg by mouth every 6 (six) hours as needed (cold).   Yes [provider]  Etonogestrel (IMPLANON [place]) Inject 1 each into the skin once.   Yes [provider]  ibuprofen (ADVIL,MOTRIN) 200 MG tablet Take 200 mg by mouth every 6 (six) hours as needed for moderate pain.   Yes [provider]  ondansetron (ZOFRAN ODT) 4 MG disintegrating tablet Take 1 tablet (4 mg total) by mouth every 8 (eight) hours as needed. 12/08/17   Jacalyn LefevreHaviland, Dionel Archey, MD  traMADol (ULTRAM) 50 MG tablet Take 1 tablet (50 mg total) by mouth every 6 (six) hours as needed. 12/08/17   Jacalyn LefevreHaviland, Georgi Tuel, MD    Family History Family History  Problem Relation Age of Onset  . Diabetes Mother   . Diabetes Maternal Grandmother    . Diabetes Maternal Grandfather     Social History Social History   Tobacco Use  . Smoking status: Current Some Day Smoker    Packs/day: 0.25    Years: 2.00    Pack years: 0.50    Types: Cigarettes    Last attempt to quit: 05/04/2013    Years since quitting: 4.6  . Smokeless tobacco: Never Used  Substance Use Topics  . Alcohol use: No    Alcohol/week: 0.0 oz    Comment: occ  . Drug use: No     Allergies   Tylenol [acetaminophen]   Review of Systems Review of Systems  Gastrointestinal: Positive for abdominal pain, diarrhea, nausea and vomiting.  All other systems reviewed and are negative.    Physical Exam Updated Vital Signs BP (!) 150/78   Pulse 79   Temp 98.4 F (36.9 C) (Oral)   Resp 20   Ht 5\' 8"  (1.727 m)   Wt (!) 140.6 kg (310 lb)   SpO2 100%   BMI 47.14 kg/m   Physical Exam  Constitutional: She is oriented to person, place, and time. She appears well-developed and well-nourished.  HENT:  Head:  Normocephalic and atraumatic.  Right Ear: External ear normal.  Left Ear: External ear normal.  Nose: Nose normal.  Mouth/Throat: Mucous membranes are dry.  Eyes: Conjunctivae and EOM are normal. Pupils are equal, round, and reactive to light.  Neck: Normal range of motion. Neck supple.  Cardiovascular: Normal rate, regular rhythm, normal heart sounds and intact distal pulses.  Pulmonary/Chest: Effort normal and breath sounds normal.  Abdominal: Soft. Bowel sounds are normal.  Musculoskeletal: Normal range of motion.  Neurological: She is alert and oriented to person, place, and time.  Skin: Skin is warm. Capillary refill takes less than 2 seconds.  Psychiatric: She has a normal mood and affect. Her behavior is normal. Judgment and thought content normal.  Nursing note and vitals reviewed.    ED Treatments / Results  Labs (all labs ordered are listed, but only abnormal results are displayed) Labs Reviewed  COMPREHENSIVE METABOLIC PANEL - Abnormal;  Notable for the following components:      Result Value   CO2 21 (*)    Glucose, Bld 126 (*)    All other components within normal limits  URINALYSIS, ROUTINE W REFLEX MICROSCOPIC - Abnormal; Notable for the following components:   Hgb urine dipstick TRACE (*)    Ketones, ur 15 (*)    All other components within normal limits  URINALYSIS, MICROSCOPIC (REFLEX) - Abnormal; Notable for the following components:   Bacteria, UA RARE (*)    Squamous Epithelial / LPF 0-5 (*)    All other components within normal limits  LIPASE, BLOOD  CBC  I-STAT BETA HCG BLOOD, ED ([facility], [facility], [facility] ONLY)    EKG  EKG Interpretation None       Radiology No results found.  Procedures Procedures (including critical care time)  Medications Ordered in ED Medications  sodium chloride 0.9 % bolus 1,000 mL (1,000 mLs Intravenous New Bag/Given 12/08/17 1047)  ondansetron (ZOFRAN) injection 4 mg (4 mg Intravenous Given 12/08/17 1047)  morphine 4 MG/ML injection 4 mg (4 mg Intravenous Given 12/08/17 1047)  famotidine (PEPCID) IVPB 20 mg premix (0 mg Intravenous Stopped 12/08/17 1151)  morphine 4 MG/ML injection 4 mg (4 mg Intravenous Given 12/08/17 1307)     Initial Impression / Assessment and Plan / ED Course  I have reviewed the triage vital signs and the nursing notes.  Pertinent labs & imaging results that were available during my care of the patient were reviewed by me and considered in my medical decision making (see chart for details).    Pt is feeling much better.  She is stable for d/c.  Return if worse.  Final Clinical Impressions(s) / ED Diagnoses   Final diagnoses:  Dehydration  Nausea vomiting and diarrhea    ED Discharge Orders        Ordered    ondansetron (ZOFRAN ODT) 4 MG disintegrating tablet  Every 8 hours PRN     12/08/17 1303    traMADol (ULTRAM) 50 MG tablet  Every 6 hours PRN     12/08/17 1303       Jacalyn Lefevre, MD 12/08/17 1308

## 2018-04-29 IMAGING — DX DG ABDOMEN ACUTE W/ 1V CHEST
4 series · 4 of 4 positions shown · non-contrast
Comparison: None.

CLINICAL DATA: Abdominal pain. Low back and flank pain. Shortness
of breath. Symptoms since 4 days ago.

EXAM:
DG ABDOMEN ACUTE W/ 1V CHEST

[w chest pa]
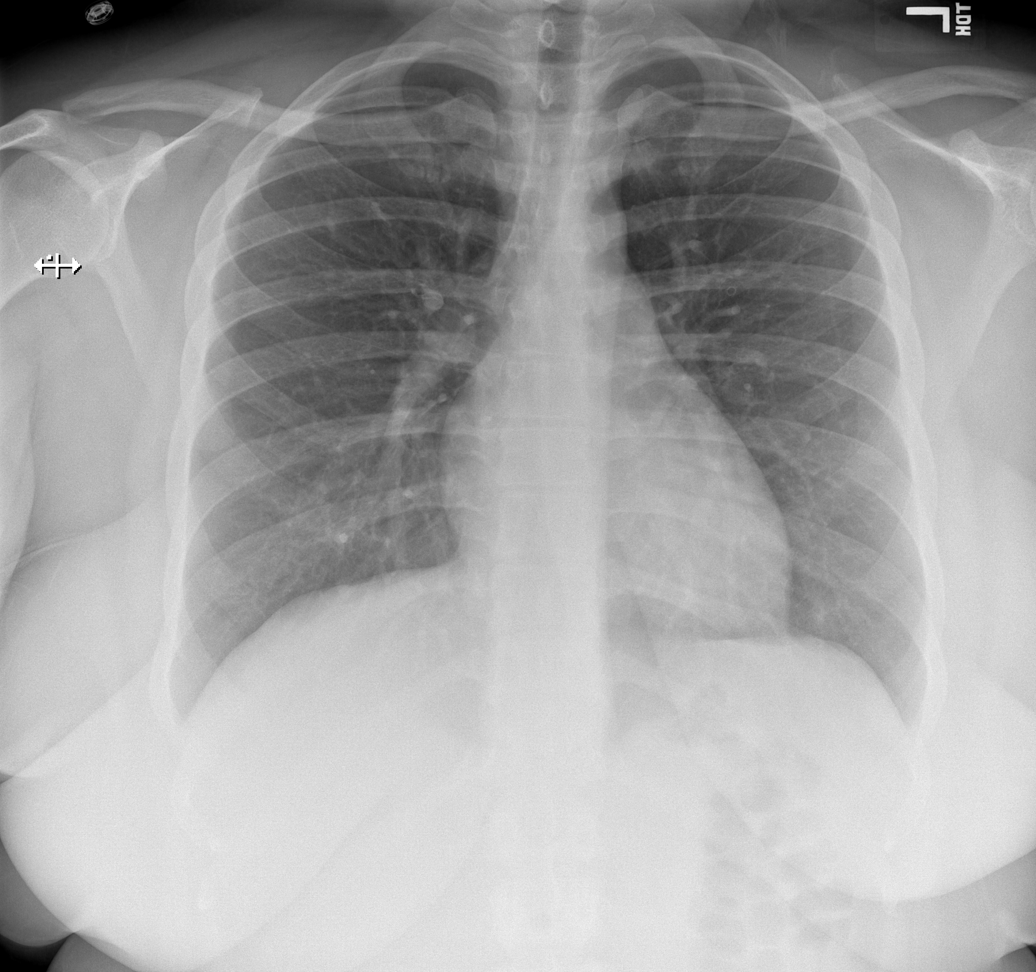

[w abdomen upright]
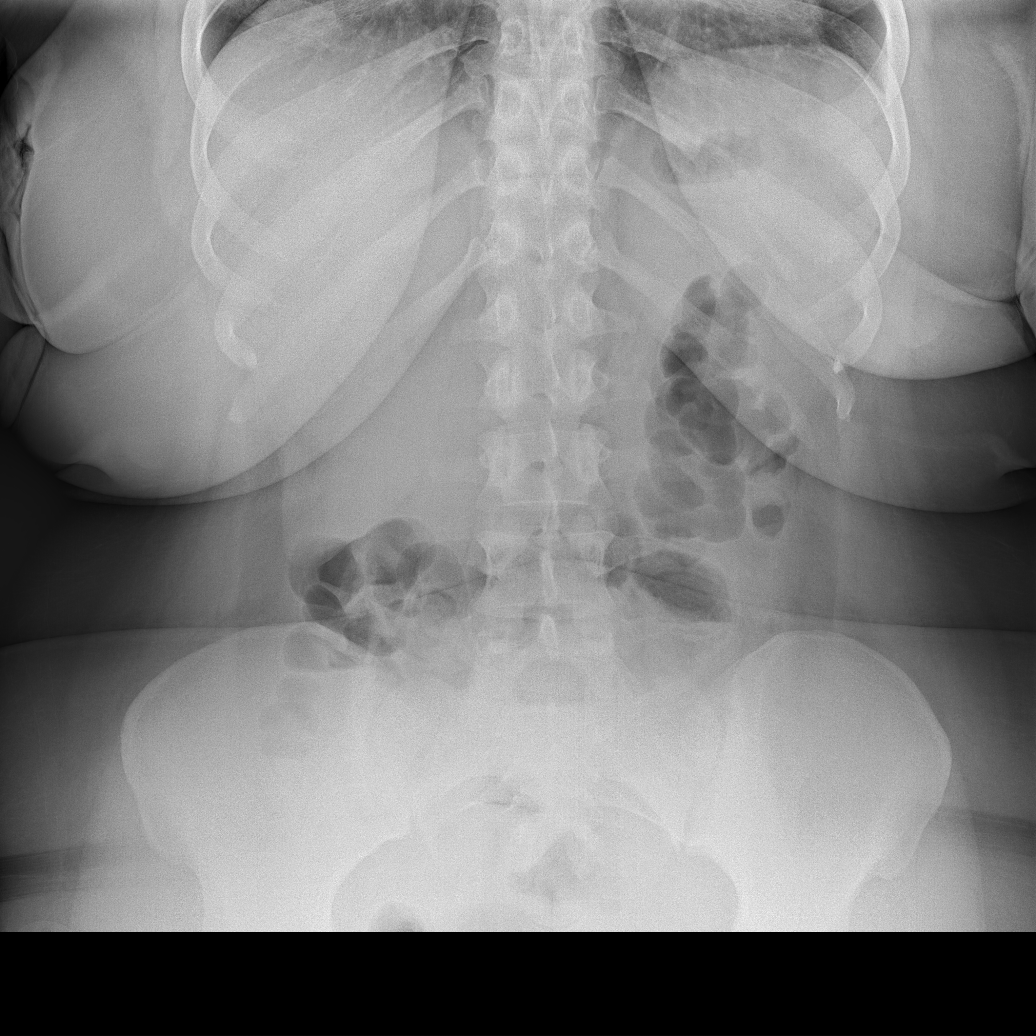

[t abdomen supine (1 of 2)]
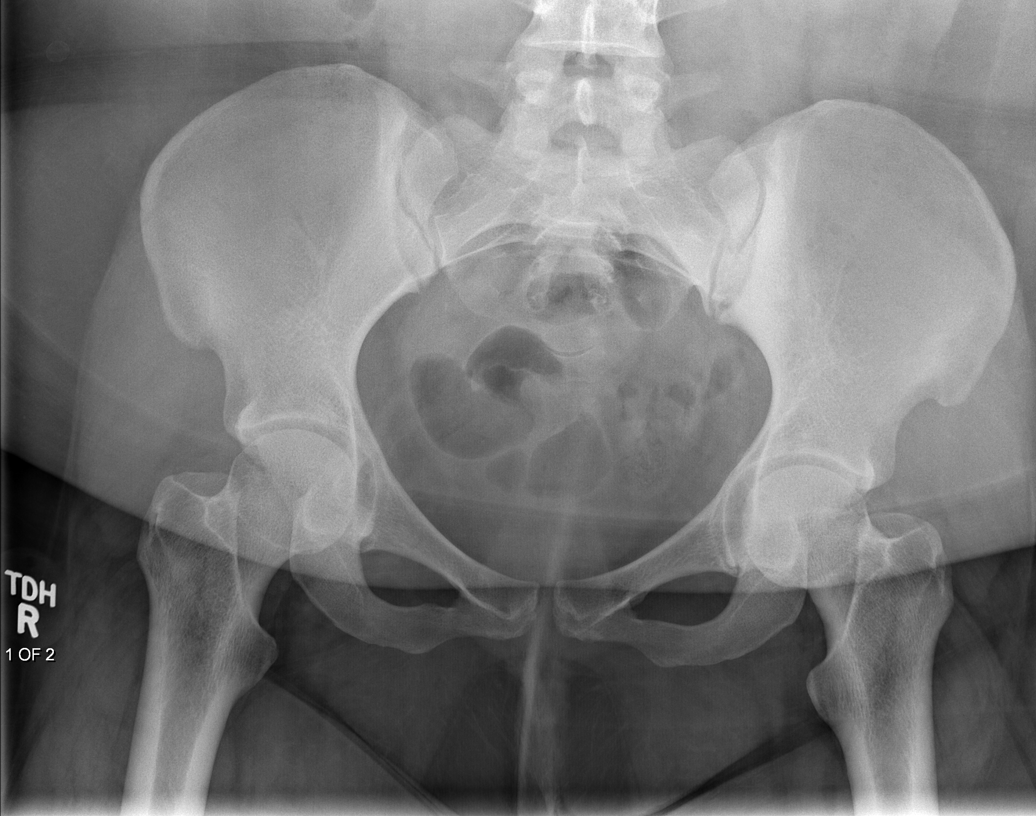

[t abdomen supine (2 of 2)]
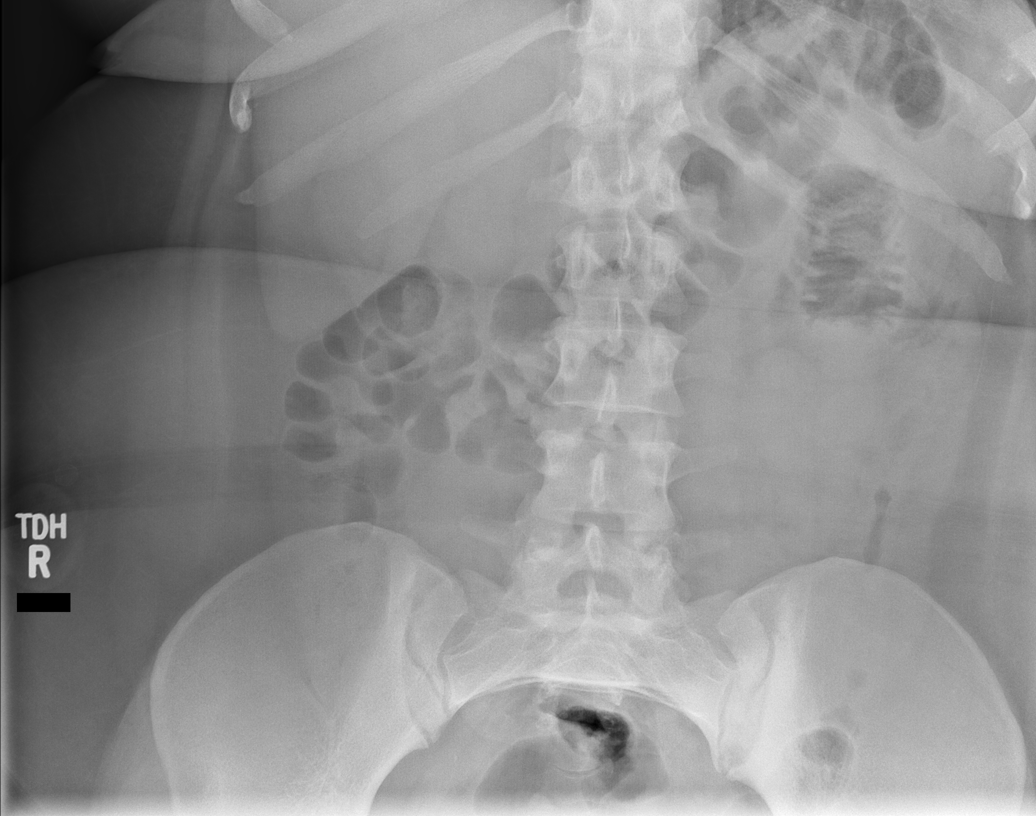

[4 of 4 positions shown; findings below may reference images not displayed]

FINDINGS: One-view chest shows normal heart and mediastinal shadows. The lungs
are clear. No effusion. No free air under the diaphragm.

Bowel gas pattern is normal without evidence of ileus, obstruction
or free air. No abnormal calcifications or bony findings.
IMPRESSION: Negative abdominal radiographs.  No acute cardiopulmonary disease.

## 2018-05-26 ENCOUNTER — Ambulatory Visit: Payer: BC Managed Care – PPO | Admitting: Obstetrics and Gynecology

## 2018-07-07 ENCOUNTER — Ambulatory Visit (INDEPENDENT_AMBULATORY_CARE_PROVIDER_SITE_OTHER): Payer: BC Managed Care – PPO | Admitting: Student

## 2018-07-07 ENCOUNTER — Encounter: Payer: Self-pay | Admitting: Student

## 2018-07-07 VITALS — BP 137/83 | HR 77 | Ht 68.0 in | Wt 290.3 lb

## 2018-07-07 DIAGNOSIS — Z113 Encounter for screening for infections with a predominantly sexual mode of transmission: Secondary | ICD-10-CM | POA: Diagnosis not present

## 2018-07-07 DIAGNOSIS — N921 Excessive and frequent menstruation with irregular cycle: Secondary | ICD-10-CM

## 2018-07-07 DIAGNOSIS — Z975 Presence of (intrauterine) contraceptive device: Secondary | ICD-10-CM

## 2018-07-07 MED ORDER — IBUPROFEN 600 MG PO TABS: 600.0000 mg | ORAL_TABLET | Freq: Three times a day (TID) | ORAL | 2 refills | Status: DC

## 2018-07-07 NOTE — Progress Notes (Signed)
History:  Kayla Bautista is a 27 y.o. G1P1001 who presents to clinic today for vaginal bleeding. Had nexplanon replaced 02/2017. States since then has had regular monthly bleeding cycles that were heavier than with her previous nexplanon. The last 3 months have been her heaviest yet. Reports cycles that last 4-7 days, heavy every day of episode. Has to change her full pad every 1-2 hours. Has to wake up twice per night to change pads. Also reports increase abdominal cramping with bleeding.  She is currently not bleeding; states stopped a few days ago.  While here, she would also like STI testing. Had 2 partners since August.    Patient Active Problem List   Diagnosis Date Noted  . BMI 45.0-49.9, adult (HCC) 03/03/2014  . Hypertension 03/03/2014  . Yeast infection involving the vagina and surrounding area 08/10/2013    Allergies  Allergen Reactions  . Tylenol [Acetaminophen] Itching    Throat itches    Current Outpatient Medications on File Prior to Visit  Medication Sig Dispense Refill  . aspirin-sod bicarb-citric acid (ALKA-SELTZER) 325 MG TBEF tablet Take 325 mg by mouth every 6 (six) hours as needed (cold).    . Etonogestrel (IMPLANON [place]) Inject 1 each into the skin once.    . ondansetron (ZOFRAN ODT) 4 MG disintegrating tablet Take 1 tablet (4 mg total) by mouth every 8 (eight) hours as needed. 10 tablet 0  . traMADol (ULTRAM) 50 MG tablet Take 1 tablet (50 mg total) by mouth every 6 (six) hours as needed. 15 tablet 0   Current Facility-Administered Medications on File Prior to Visit  Medication Dose Route Frequency Provider Last Rate Last Dose  . triamterene-hydrochlorothiazide (DYAZIDE) 37.5-25 MG per capsule 1 capsule  1 capsule Oral Q breakfast Brock Bad, MD         The following portions of the patient's history were reviewed and updated as appropriate: allergies, current medications, family history, past medical history, social history, past surgical history  and problem list.  Review of Systems:  Other than those mentioned in HPI all ROS negative   Objective:  Physical Exam BP 137/83   Pulse 77   Ht 5\' 8"  (1.727 m)   Wt 290 lb 4.8 oz (131.7 kg)   LMP 06/22/2018 (Exact Date) Comment: 4 days, heavy with clots  BMI 44.14 kg/m  CONSTITUTIONAL: Well-developed, well-nourished female in no acute distress.  EYES: EOM intact, conjunctivae normal, no scleral icterus HEAD: Normocephalic, atraumatic CARDIOVASCULAR: Normal heart rate noted, regular rhythm. No cyanosis or edema. 2+ distal pulses.  RESPIRATORY: Clear to auscultation bilaterally. Effort and breath sounds normal, no problems with respiration noted. GENITOURINARY: Normal appearing external genitalia; normal appearing vaginal mucosa and cervix.  Normal appearing discharge. No adnexal mass/tenderness. Difficult to palpate uterine size d/t body habitus.  SKIN: Skin is warm and dry. No rash noted. Not diaphoretic. No erythema. No pallor.  Labs and Imaging Orders Placed This Encounter  Procedures  . US Transvaginal Non-OB  . CBC  . HIV Antibody (routine testing w rflx)  . RPR    Negative UPT Assessment & Plan:  1. Screen for STD (sexually transmitted disease)  - Cervicovaginal ancillary only - CBC - HIV Antibody (routine testing w rflx) - RPR  2. Breakthrough bleeding on Nexplanon -pt to f/u if symptoms continue or worsen - ibuprofen (ADVIL,MOTRIN) 600 MG tablet; Take 1 tablet (600 mg total) by mouth every 8 (eight) hours. Take every 8 hours for 5 days during vaginal bleeding episodes.  Dispense: 15 tablet; Refill: 2 - US Transvaginal Non-OB; Future   Judeth Horn, NP 07/07/2018 8:23 PM

## 2018-07-07 NOTE — Patient Instructions (Signed)
Health Maintenance, Female Adopting a healthy lifestyle and getting preventive care can go a long way to promote health and wellness. Talk with your health care provider about what schedule of regular examinations is right for you. This is a good chance for you to check in with your provider about disease prevention and staying healthy. In between checkups, there are plenty of things you can do on your own. Experts have done a lot of research about which lifestyle changes and preventive measures are most likely to keep you healthy. Ask your health care provider for more information. Weight and diet Eat a healthy diet  Be sure to include plenty of vegetables, fruits, low-fat dairy products, and lean protein.  Do not eat a lot of foods high in solid fats, added sugars, or salt.  Get regular exercise. This is one of the most important things you can do for your health. ? Most adults should exercise for at least 150 minutes each week. The exercise should increase your heart rate and make you sweat (moderate-intensity exercise). ? Most adults should also do strengthening exercises at least twice a week. This is in addition to the moderate-intensity exercise.  Maintain a healthy weight  Body mass index (BMI) is a measurement that can be used to identify possible weight problems. It estimates body fat based on height and weight. Your health care provider can help determine your BMI and help you achieve or maintain a healthy weight.  For females 20 years of age and older: ? A BMI below 18.5 is considered underweight. ? A BMI of 18.5 to 24.9 is normal. ? A BMI of 25 to 29.9 is considered overweight. ? A BMI of 30 and above is considered obese.  Watch levels of cholesterol and blood lipids  You should start having your blood tested for lipids and cholesterol at 27 years of age, then have this test every 5 years.  You may need to have your cholesterol levels checked more often if: ? Your lipid or  cholesterol levels are high. ? You are older than 27 years of age. ? You are at high risk for heart disease.  Cancer screening Lung Cancer  Lung cancer screening is recommended for adults 55-80 years old who are at high risk for lung cancer because of a history of smoking.  A yearly low-dose CT scan of the lungs is recommended for people who: ? Currently smoke. ? Have quit within the past 15 years. ? Have at least a 30-pack-year history of smoking. A pack year is smoking an average of one pack of cigarettes a day for 1 year.  Yearly screening should continue until it has been 15 years since you quit.  Yearly screening should stop if you develop a health problem that would prevent you from having lung cancer treatment.  Breast Cancer  Practice breast self-awareness. This means understanding how your breasts normally appear and feel.  It also means doing regular breast self-exams. Let your health care provider know about any changes, no matter how small.  If you are in your 20s or 30s, you should have a clinical breast exam (CBE) by a health care provider every 1-3 years as part of a regular health exam.  If you are 40 or older, have a CBE every year. Also consider having a breast X-ray (mammogram) every year.  If you have a family history of breast cancer, talk to your health care provider about genetic screening.  If you are at high risk   for breast cancer, talk to your health care provider about having an MRI and a mammogram every year.  Breast cancer gene (BRCA) assessment is recommended for women who have family members with BRCA-related cancers. BRCA-related cancers include: ? Breast. ? Ovarian. ? Tubal. ? Peritoneal cancers.  Results of the assessment will determine the need for genetic counseling and BRCA1 and BRCA2 testing.  Cervical Cancer Your health care provider may recommend that you be screened regularly for cancer of the pelvic organs (ovaries, uterus, and  vagina). This screening involves a pelvic examination, including checking for microscopic changes to the surface of your cervix (Pap test). You may be encouraged to have this screening done every 3 years, beginning at age 22.  For women ages 56-65, health care providers may recommend pelvic exams and Pap testing every 3 years, or they may recommend the Pap and pelvic exam, combined with testing for human papilloma virus (HPV), every 5 years. Some types of HPV increase your risk of cervical cancer. Testing for HPV may also be done on women of any age with unclear Pap test results.  Other health care providers may not recommend any screening for nonpregnant women who are considered low risk for pelvic cancer and who do not have symptoms. Ask your health care provider if a screening pelvic exam is right for you.  If you have had past treatment for cervical cancer or a condition that could lead to cancer, you need Pap tests and screening for cancer for at least 20 years after your treatment. If Pap tests have been discontinued, your risk factors (such as having a new sexual partner) need to be reassessed to determine if screening should resume. Some women have medical problems that increase the chance of getting cervical cancer. In these cases, your health care provider may recommend more frequent screening and Pap tests.  Colorectal Cancer  This type of cancer can be detected and often prevented.  Routine colorectal cancer screening usually begins at 27 years of age and continues through 27 years of age.  Your health care provider may recommend screening at an earlier age if you have risk factors for colon cancer.  Your health care provider may also recommend using home test kits to check for hidden blood in the stool.  A small camera at the end of a tube can be used to examine your colon directly (sigmoidoscopy or colonoscopy). This is done to check for the earliest forms of colorectal  cancer.  Routine screening usually begins at age 33.  Direct examination of the colon should be repeated every 5-10 years through 27 years of age. However, you may need to be screened more often if early forms of precancerous polyps or small growths are found.  Skin Cancer  Check your skin from head to toe regularly.  Tell your health care provider about any new moles or changes in moles, especially if there is a change in a mole's shape or color.  Also tell your health care provider if you have a mole that is larger than the size of a pencil eraser.  Always use sunscreen. Apply sunscreen liberally and repeatedly throughout the day.  Protect yourself by wearing long sleeves, pants, a wide-brimmed hat, and sunglasses whenever you are outside.  Heart disease, diabetes, and high blood pressure  High blood pressure causes heart disease and increases the risk of stroke. High blood pressure is more likely to develop in: ? People who have blood pressure in the high end of  the normal range (130-139/85-89 mm Hg). ? People who are overweight or obese. ? People who are African American.  If you are 21-29 years of age, have your blood pressure checked every 3-5 years. If you are 3 years of age or older, have your blood pressure checked every year. You should have your blood pressure measured twice-once when you are at a hospital or clinic, and once when you are not at a hospital or clinic. Record the average of the two measurements. To check your blood pressure when you are not at a hospital or clinic, you can use: ? An automated blood pressure machine at a pharmacy. ? A home blood pressure monitor.  If you are between 17 years and 37 years old, ask your health care provider if you should take aspirin to prevent strokes.  Have regular diabetes screenings. This involves taking a blood sample to check your fasting blood sugar level. ? If you are at a normal weight and have a low risk for diabetes,  have this test once every three years after 27 years of age. ? If you are overweight and have a high risk for diabetes, consider being tested at a younger age or more often. Preventing infection Hepatitis B  If you have a higher risk for hepatitis B, you should be screened for this virus. You are considered at high risk for hepatitis B if: ? You were born in a country where hepatitis B is common. Ask your health care provider which countries are considered high risk. ? Your parents were born in a high-risk country, and you have not been immunized against hepatitis B (hepatitis B vaccine). ? You have HIV or AIDS. ? You use needles to inject street drugs. ? You live with someone who has hepatitis B. ? You have had sex with someone who has hepatitis B. ? You get hemodialysis treatment. ? You take certain medicines for conditions, including cancer, organ transplantation, and autoimmune conditions.  Hepatitis C  Blood testing is recommended for: ? Everyone born from [birth year] through 1965. ? Anyone with known risk factors for hepatitis C.  Sexually transmitted infections (STIs)  You should be screened for sexually transmitted infections (STIs) including gonorrhea and chlamydia if: ? You are sexually active and are younger than 27 years of age. ? You are older than 27 years of age and your health care provider tells you that you are at risk for this type of infection. ? Your sexual activity has changed since you were last screened and you are at an increased risk for chlamydia or gonorrhea. Ask your health care provider if you are at risk.  If you do not have HIV, but are at risk, it may be recommended that you take a prescription medicine daily to prevent HIV infection. This is called pre-exposure prophylaxis (PrEP). You are considered at risk if: ? You are sexually active and do not regularly use condoms or know the HIV status of your partner(s). ? You take drugs by injection. ? You are  sexually active with a partner who has HIV.  Talk with your health care provider about whether you are at high risk of being infected with HIV. If you choose to begin PrEP, you should first be tested for HIV. You should then be tested every 3 months for as long as you are taking PrEP. Pregnancy  If you are premenopausal and you may become pregnant, ask your health care provider about preconception counseling.  If you may become  pregnant, take 400 to 800 micrograms (mcg) of folic acid every day.  If you want to prevent pregnancy, talk to your health care provider about birth control (contraception). Osteoporosis and menopause  Osteoporosis is a disease in which the bones lose minerals and strength with aging. This can result in serious bone fractures. Your risk for osteoporosis can be identified using a bone density scan.  If you are 61 years of age or older, or if you are at risk for osteoporosis and fractures, ask your health care provider if you should be screened.  Ask your health care provider whether you should take a calcium or vitamin D supplement to lower your risk for osteoporosis.  Menopause may have certain physical symptoms and risks.  Hormone replacement therapy may reduce some of these symptoms and risks. Talk to your health care provider about whether hormone replacement therapy is right for you. Follow these instructions at home:  Schedule regular health, dental, and eye exams.  Stay current with your immunizations.  Do not use any tobacco products including cigarettes, chewing tobacco, or electronic cigarettes.  If you are pregnant, do not drink alcohol.  If you are breastfeeding, limit how much and how often you drink alcohol.  Limit alcohol intake to no more than 1 drink per day for nonpregnant women. One drink equals 12 ounces of beer, 5 ounces of wine, or 1 ounces of hard liquor.  Do not use street drugs.  Do not share needles.  Ask your health care  provider for help if you need support or information about quitting drugs.  Tell your health care provider if you often feel depressed.  Tell your health care provider if you have ever been abused or do not feel safe at home. This information is not intended to replace advice given to you by your health care provider. Make sure you discuss any questions you have with your health care provider. Document Released: 03/31/2011 Document Revised: 02/21/2016 Document Reviewed: 06/19/2015 Elsevier Interactive Patient Education  2018 [employer]. Abnormal Uterine Bleeding Abnormal uterine bleeding can affect women at various stages in life, including teenagers, women in their reproductive years, pregnant women, and women who have reached menopause. Several kinds of uterine bleeding are considered abnormal, including:  Bleeding or spotting between periods.  Bleeding after sexual intercourse.  Bleeding that is heavier or more than normal.  Periods that last longer than usual.  Bleeding after menopause.  Many cases of abnormal uterine bleeding are minor and simple to treat, while others are more serious. Any type of abnormal bleeding should be evaluated by your health care provider. Treatment will depend on the cause of the bleeding. Follow these instructions at home: Monitor your condition for any changes. The following actions may help to alleviate any discomfort you are experiencing:  Avoid the use of tampons and douches as directed by your health care provider.  Change your pads frequently.  You should get regular pelvic exams and Pap tests. Keep all follow-up appointments for diagnostic tests as directed by your health care provider. Contact a health care provider if:  Your bleeding lasts more than 1 week.  You feel dizzy at times. Get help right away if:  You pass out.  You are changing pads every 15 to 30 minutes.  You have abdominal pain.  You have a fever.  You become  sweaty or weak.  You are passing large blood clots from the vagina.  You start to feel nauseous and vomit. This information is  not intended to replace advice given to you by your health care provider. Make sure you discuss any questions you have with your health care provider. Document Released: 09/15/2005 Document Revised: 02/27/2016 Document Reviewed: 04/14/2013 Elsevier Interactive Patient Education  2017 [employer].

## 2018-07-08 ENCOUNTER — Telehealth: Payer: Self-pay | Admitting: *Deleted

## 2018-07-08 LAB — CBC
HEMATOCRIT: 35 % (ref 34.0–46.6)
Hemoglobin: 11.3 g/dL (ref 11.1–15.9)
MCH: 28.2 pg (ref 26.6–33.0)
MCHC: 32.3 g/dL (ref 31.5–35.7)
MCV: 87 fL (ref 79–97)
Platelets: 237 10*3/uL (ref 150–450)
RBC: 4.01 x10E6/uL (ref 3.77–5.28)
RDW: 13 % (ref 12.3–15.4)
WBC: 6.3 10*3/uL (ref 3.4–10.8)

## 2018-07-08 LAB — POCT PREGNANCY, URINE: PREG TEST UR: NEGATIVE

## 2018-07-08 LAB — HIV ANTIBODY (ROUTINE TESTING W REFLEX): HIV Screen 4th Generation wRfx: NONREACTIVE

## 2018-07-08 LAB — RPR: RPR Ser Ql: NONREACTIVE

## 2018-07-08 NOTE — Telephone Encounter (Signed)
Called pt to inform her of her ultrasound appointment on Tuesday 07/13/18 @ 0830 at [facility].  Pt to arrive at 0815 with a full bladder.  Pt did not pick up.  Left message requesting pt call the office to get the time, location and instructions for her appointment.

## 2018-07-08 NOTE — Telephone Encounter (Signed)
Called pt to inform her of her ultrasound appointment on Tuesday 07/13/18 @ 0830 at [facility].  Pt would like to schedule at a different time, gave pt telephone number where she can reschedule. Pt verbalized understanding.

## 2018-07-09 LAB — CERVICOVAGINAL ANCILLARY ONLY
CHLAMYDIA, DNA PROBE: NEGATIVE
Neisseria Gonorrhea: NEGATIVE
Trichomonas: NEGATIVE

## 2018-07-13 ENCOUNTER — Ambulatory Visit (HOSPITAL_COMMUNITY): Payer: BC Managed Care – PPO

## 2018-07-13 ENCOUNTER — Ambulatory Visit (HOSPITAL_COMMUNITY)
Admission: RE | Admit: 2018-07-13 | Discharge: 2018-07-13 | Disposition: A | Discharge status: Home / Self Care | Payer: BC Managed Care – PPO | Source: Ambulatory Visit | Attending: Student | Admitting: Student

## 2018-07-13 DIAGNOSIS — N921 Excessive and frequent menstruation with irregular cycle: Secondary | ICD-10-CM | POA: Insufficient documentation

## 2018-07-13 DIAGNOSIS — Z975 Presence of (intrauterine) contraceptive device: Secondary | ICD-10-CM

## 2018-09-08 ENCOUNTER — Ambulatory Visit: Payer: BC Managed Care – PPO | Admitting: Obstetrics & Gynecology

## 2020-02-06 ENCOUNTER — Ambulatory Visit: Payer: BC Managed Care – PPO | Admitting: Certified Nurse Midwife

## 2020-02-14 ENCOUNTER — Other Ambulatory Visit (HOSPITAL_COMMUNITY)
Admission: RE | Admit: 2020-02-14 | Discharge: 2020-02-14 | Disposition: A | Discharge status: Home / Self Care | Payer: Medicaid Other | Source: Ambulatory Visit | Attending: Obstetrics and Gynecology | Admitting: Obstetrics and Gynecology

## 2020-02-14 ENCOUNTER — Ambulatory Visit (INDEPENDENT_AMBULATORY_CARE_PROVIDER_SITE_OTHER): Payer: Medicaid Other | Admitting: Women's Health

## 2020-02-14 ENCOUNTER — Telehealth: Payer: Self-pay | Admitting: Obstetrics and Gynecology

## 2020-02-14 ENCOUNTER — Other Ambulatory Visit: Payer: Self-pay

## 2020-02-14 ENCOUNTER — Encounter: Payer: Self-pay | Admitting: Women's Health

## 2020-02-14 VITALS — BP 135/105 | HR 82 | Wt 300.3 lb

## 2020-02-14 DIAGNOSIS — Z01419 Encounter for gynecological examination (general) (routine) without abnormal findings: Secondary | ICD-10-CM

## 2020-02-14 DIAGNOSIS — Z3046 Encounter for surveillance of implantable subdermal contraceptive: Secondary | ICD-10-CM

## 2020-02-14 DIAGNOSIS — Z124 Encounter for screening for malignant neoplasm of cervix: Secondary | ICD-10-CM

## 2020-02-14 DIAGNOSIS — I10 Essential (primary) hypertension: Secondary | ICD-10-CM

## 2020-02-14 NOTE — Patient Instructions (Addendum)
Breast Self-Awareness Breast self-awareness means being familiar with how your breasts look and feel. It involves checking your breasts regularly and reporting any changes to your health care provider. Practicing breast self-awareness is important. Sometimes changes may not be harmful (are benign), but sometimes a change in your breasts can be a sign of a serious medical problem. It is important to learn how to do this procedure correctly so that you can catch problems early, when treatment is more likely to be successful. All women should practice breast self-awareness, including women who have had breast implants. What you need:  A mirror.  A well-lit room. How to do a breast self-exam A breast self-exam is one way to learn what is normal for your breasts and whether your breasts are changing. To do a breast self-exam: Look for changes  1. Remove all the clothing above your waist. 2. Stand in front of a mirror in a room with good lighting. 3. Put your hands on your hips. 4. Push your hands firmly downward. 5. Compare your breasts in the mirror. Look for differences between them (asymmetry), such as: ? Differences in shape. ? Differences in size. ? Puckers, dips, and bumps in one breast and not the other. 6. Look at each breast for changes in the skin, such as: ? Redness. ? Scaly areas. 7. Look for changes in your nipples, such as: ? Discharge. ? Bleeding. ? Dimpling. ? Redness. ? A change in position. Feel for changes Carefully feel your breasts for lumps and changes. It is best to do this while lying on your back on the floor, and again while sitting or standing in the tub or shower with soapy water on your skin. Feel each breast in the following way: 1. Place the arm on the side of the breast you are examining above your head. 2. Feel your breast with the other hand. 3. Start in the nipple area and make -inch (2 cm) overlapping circles to feel your breast. Use the pads of your  three middle fingers to do this. Apply light pressure, then medium pressure, then firm pressure. The light pressure will allow you to feel the tissue closest to the skin. The medium pressure will allow you to feel the tissue that is a little deeper. The firm pressure will allow you to feel the tissue close to the ribs. 4. Continue the overlapping circles, moving downward over the breast until you feel your ribs below your breast. 5. Move one finger-width toward the center of the body. Continue to use the -inch (2 cm) overlapping circles to feel your breast as you move slowly up toward your collarbone. 6. Continue the up-and-down exam using all three pressures until you reach your armpit.  Write down what you find Writing down what you find can help you remember what to discuss with your health care provider. Write down:  What is normal for each breast.  Any changes that you find in each breast, including: ? The kind of changes you find. ? Any pain or tenderness. ? Size and location of any lumps.  Where you are in your menstrual cycle, if you are still menstruating. General tips and recommendations  Examine your breasts every month.  If you are breastfeeding, the best time to examine your breasts is after a feeding or after using a breast pump.  If you menstruate, the best time to examine your breasts is 5-7 days after your period. Breasts are generally lumpier during menstrual periods, and it may  be more difficult to notice changes.  With time and practice, you will become more familiar with the variations in your breasts and more comfortable with the exam. Contact a health care provider if you:  See a change in the shape or size of your breasts or nipples.  See a change in the skin of your breast or nipples, such as a reddened or scaly area.  Have unusual discharge from your nipples.  Find a lump or thick area that was not there before.  Have pain in your breasts.  Have any  concerns related to your breast health. Summary  Breast self-awareness includes looking for physical changes in your breasts, as well as feeling for any changes within your breasts.  Breast self-awareness should be performed in front of a mirror in a well-lit room.  You should examine your breasts every month. If you menstruate, the best time to examine your breasts is 5-7 days after your menstrual period.  Let your health care provider know of any changes you notice in your breasts, including changes in size, changes on the skin, pain or tenderness, or unusual fluid from your nipples. This information is not intended to replace advice given to you by your health care provider. Make sure you discuss any questions you have with your health care provider. Document Revised: 05/04/2018 Document Reviewed: 05/04/2018 Elsevier Patient Education  2020 [place] have had the Nexplanon removed today. Your return to fertility is immediate and you could become pregnant at any time. It may take a few months for your periods to return to normal. Keep the outer bandage on and keep it clean and dry for the next 24 hours. Tomorrow morning, you can remove the bandage and shower as usual. The stickers on the skin will fall off on their own over the next 1-2 weeks. Do not peel them off.  Do not soak your arm (no bath tubs, hot tubs, swimming pools, etc.) until the incision has completely healed, usually within about 1-2 weeks. If you notice any signs of infection (increased pain, redness, warmth, drainage, fever above 100.4 degrees), call back to the office immediately.       Preventive Care 67-62 Years Old, Female Preventive care refers to visits with your health care provider and lifestyle choices that can promote health and wellness. This includes:  A yearly physical exam. This may also be called an annual well check.  Regular dental visits and eye exams.  Immunizations.  Screening for  certain conditions.  Healthy lifestyle choices, such as eating a healthy diet, getting regular exercise, not using drugs or products that contain nicotine and tobacco, and limiting alcohol use. What can I expect for my preventive care visit? Physical exam Your health care provider will check your:  Height and weight. This may be used to calculate body mass index (BMI), which tells if you are at a healthy weight.  Heart rate and blood pressure.  Skin for abnormal spots. Counseling Your health care provider may ask you questions about your:  Alcohol, tobacco, and drug use.  Emotional well-being.  Home and relationship well-being.  Sexual activity.  Eating habits.  Work and work Statistician.  Method of birth control.  Menstrual cycle.  Pregnancy history. What immunizations do I need?  Influenza (flu) vaccine  This is recommended every year. Tetanus, diphtheria, and pertussis (Tdap) vaccine  You may need a Td booster every 10 years. Varicella (chickenpox) vaccine  You may need this if you  have not been vaccinated. Human papillomavirus (HPV) vaccine  If recommended by your health care provider, you may need three doses over 6 months. Measles, mumps, and rubella (MMR) vaccine  You may need at least one dose of MMR. You may also need a second dose. Meningococcal conjugate (MenACWY) vaccine  One dose is recommended if you are age 22-21 years and a first-year college student living in a residence hall, or if you have one of several medical conditions. You may also need additional booster doses. Pneumococcal conjugate (PCV13) vaccine  You may need this if you have certain conditions and were not previously vaccinated. Pneumococcal polysaccharide (PPSV23) vaccine  You may need one or two doses if you smoke cigarettes or if you have certain conditions. Hepatitis A vaccine  You may need this if you have certain conditions or if you travel or work in places where you may  be exposed to hepatitis A. Hepatitis B vaccine  You may need this if you have certain conditions or if you travel or work in places where you may be exposed to hepatitis B. Haemophilus influenzae type b (Hib) vaccine  You may need this if you have certain conditions. You may receive vaccines as individual doses or as more than one vaccine together in one shot (combination vaccines). Talk with your health care provider about the risks and benefits of combination vaccines. What tests do I need?  Blood tests  Lipid and cholesterol levels. These may be checked every 5 years starting at age 17.  Hepatitis C test.  Hepatitis B test. Screening  Diabetes screening. This is done by checking your blood sugar (glucose) after you have not eaten for a while (fasting).  Sexually transmitted disease (STD) testing.  BRCA-related cancer screening. This may be done if you have a family history of breast, ovarian, tubal, or peritoneal cancers.  Pelvic exam and Pap test. This may be done every 3 years starting at age 35. Starting at age 63, this may be done every 5 years if you have a Pap test in combination with an HPV test. Talk with your health care provider about your test results, treatment options, and if necessary, the need for more tests. Follow these instructions at home: Eating and drinking   Eat a diet that includes fresh fruits and vegetables, whole grains, lean protein, and low-fat dairy.  Take vitamin and mineral supplements as recommended by your health care provider.  Do not drink alcohol if: ? Your health care provider tells you not to drink. ? You are pregnant, may be pregnant, or are planning to become pregnant.  If you drink alcohol: ? Limit how much you have to 0-1 drink a day. ? Be aware of how much alcohol is in your drink. In the [place] one drink equals one 12 oz bottle of beer (355 mL), one 5 oz glass of wine (148 mL), or one 1 oz glass of hard liquor (44  mL). Lifestyle  Take daily care of your teeth and gums.  Stay active. Exercise for at least 30 minutes on 5 or more days each week.  Do not use any products that contain nicotine or tobacco, such as cigarettes, e-cigarettes, and chewing tobacco. If you need help quitting, ask your health care provider.  If you are sexually active, practice safe sex. Use a condom or other form of birth control (contraception) in order to prevent pregnancy and STIs (sexually transmitted infections). If you plan to become pregnant, see your health care provider  for a preconception visit. What's next?  Visit your health care provider once a year for a well check visit.  Ask your health care provider how often you should have your eyes and teeth checked.  Stay up to date on all vaccines. This information is not intended to replace advice given to you by your health care provider. Make sure you discuss any questions you have with your health care provider. Document Revised: 05/27/2018 Document Reviewed: 05/27/2018 Elsevier Patient Education  2020 [place] [phone]) . [place] o [address] [place], [place] [address] o [phone] o Mon-Fri 8:30-12:30, 1:30-5:00 o Accepting Medicaid . [place] at [facility] [place], [place], [place] [address] o [phone] o Mon-Fri 8:00-5:30 . [place] [address] [place], [place] [address] o [phone], Tue, Thur, Fri 8:30-5:00, Wed 10:00-7:00 (closed 1-2pm) o Accepting Medicaid . [facility] o [address], [place], [place]  [address] o Phone - [phone]   Fax - [phone]  [place] [phone]) . [place] [place], [place] [address] o [phone] o Mon-Fri 8:00-5:00 . [facility] at  [facility])  o [address] [place], [place] [address] o [phone] o Mon-Fri 8:30-5:30, Sat (Oct.-Mar.) 9:00-1:00 o Accepting [facility] [phone]) . [place] at [place], [place], [place] [address] o [phone] o Mon-Fri 8:00-5:00  [place] [phone]) . [place] at [place], [place], [place] [address] o [phone] o Mon-Fri 8:00-5:00 . Therapist, music at [place], [employer], [place] [address] o [phone] o Mon-Fri 8:00-5:00 . Therapist, music at [place] [place], [place] [address] o [phone] o Mon-Fri 8:00-5:00 . [place]., [place] [address] o [phone] o Mon-Fri 7:30-5:30  [place] ([place]) . [place] [address] [place], [place] [address] o [phone] o Mon-Thur 8:00-6:00 o Accepting Medicaid . [place]., [place], [place] [address] o [phone] o Mon-Thur 7:30-7:30, Fri 7:30-4:30 o Accepting Medicaid . [place] at [place] [address], [place], [place]  [address] o [phone]   Fax - [place] [facility] [phone] & [phone]) . Therapist, music at [place] [place], [place] [address] o [phone] o Mon-Fri 7:00-5:00 . [place], [place], [place] [address] o [phone] o Mon-Fri 8:00-5:00 o Accepting Medicaid . [place], [place], [place] [address] o [phone] o Mon-Fri 8:00-5:00 o Accepting Medicaid  [facility] [place] [phone]) . [facility] at [facility] o [address] [place], [place] [address] o [phone] o Mon-Fri 8:00-5:00 . [place] ([place] at [employer]) o [address] [place], [place], [place] [address] o [phone] o Mon-Fri 8:00-5:00 o Accepting Medicaid . [place] ([facility] [employer]) o [address] [place], [place], [place] [address] o [phone] o Mon-Fri 8:00-5:30, Sat&Sun by appointment (phones open at 8:30) o Accepting [facility] [phone] & [phone]) .  [facility] o [address] [place], [place] [address] o [phone] o Mon-Thur 8:00-7:00, Fri 8:00-5:00, Sat 8:00-12:00, Sun 9:00-12:00 o Accepting Medicaid . [facility] at [facility] [address], [place], [place] [address] o [phone] o Mon-Thur 8:00-5:00 o Accepting Medicaid . [facility] at [place]., [place], [place] [address] o [phone] o Mon-Fri 8:00-5:30, Sat (Oct.-Mar.) 9:00-1:00 o Accepting [employer] [phone]) . [place] o [address] [place], [place], [place] [address] o [phone] o Mon-Fri 8:00-5:00 o Accepting Medicaid   [place] [phone]) . [place] at [place], [place], [place] [address] o [phone] o Mon-Fri 8:00-5:00 . Therapist, music at [facility] o [address] [place] [address], [place], [place] [address] o [phone] o Mon-Fri 8:00-5:00 . [place] [address], [place], [place] [address] o [phone] o Mon-Fri 8:00-5:00 o After hours clinic [place] [facility] [place], [place] [address]) [phone] Mon-Fri 5:00-8:00, Sat 12:00-6:00, Sun 10:00-4:00 o Accepting Medicaid . [place] at [facility] o [address] [place], [place]  [address] o [phone]   Fax - [phone]  [place] [phone]) . Forensic psychologist at [place] o [address], [place], [place] [address] o [phone] o Mon-Fri 8:00-5:00 . [place] ([place] at [place]) o [place] [address], [place], [place] [address] o [phone] o Mon-Thur 8:00-7:00, Fri 8:00-5:00, Sat 8:00-12:00     Hypertension, Adult High blood pressure (hypertension) is when the force of blood pumping through the arteries is too strong. The arteries are the blood vessels that carry blood from the heart throughout the body. Hypertension forces the heart to work harder to pump blood and may cause arteries to become narrow or stiff. Untreated or uncontrolled hypertension can cause a heart attack, heart failure, a stroke, kidney disease, and other problems. A blood pressure reading consists of a higher number over a lower number. Ideally, your blood pressure should be below 120/80. The first ("top") number is called the systolic pressure. It is a measure of the pressure in your arteries as your heart beats. The second ("bottom") number is called the diastolic pressure. It is a measure of the pressure in your arteries as the heart relaxes. What are the causes? The exact cause of this condition is not known. There are some conditions that result in or are related to high blood pressure. What increases the risk? Some risk factors for high blood pressure are under your control. The following factors may make you more likely to develop this condition:  Smoking.  Having type 2 diabetes mellitus, high cholesterol, or both.  Not getting enough exercise or physical activity.  Being overweight.  Having too much fat, sugar, calories, or salt (sodium) in your diet.  Drinking too much alcohol. Some risk factors for high blood pressure may be difficult or impossible to change. Some of these factors include:  Having chronic kidney disease.  Having a family history of high blood  pressure.  Age. Risk increases with age.  Race. You may be at higher risk if you are African American.  Gender. Men are at higher risk than women before age 34. After age 30, women are at higher risk than men.  Having obstructive sleep apnea.  Stress. What are the signs or symptoms? High blood pressure may not cause symptoms. Very high blood pressure (hypertensive crisis) may cause:  Headache.  Anxiety.  Shortness of breath.  Nosebleed.  Nausea and vomiting.  Vision changes.  Severe chest pain.  Seizures. How is this diagnosed? This condition is diagnosed by measuring your blood pressure while you are seated, with your arm resting on a flat surface, your legs uncrossed, and your feet flat on the floor. The cuff of the blood pressure monitor will be placed directly against the skin of your upper arm at the level of your heart. It should be measured at least twice using the same arm. Certain conditions can cause a difference in blood pressure between your right and left arms. Certain factors can cause blood pressure readings to be lower or higher than normal for a short period of time:  When your blood pressure is higher when you are in a health care provider's office than when you are at home, this is called white coat hypertension. Most people with this condition do not need medicines.  When your blood pressure is higher at home than when you are in a health care provider's office, this is called masked hypertension. Most people with this condition may need medicines to control blood pressure. If you have a high blood pressure reading during one visit or you have normal blood pressure with other risk factors, you may be asked to:  Return on a different day to have your blood pressure checked again.  Monitor your blood pressure at home for 1 week or longer. If you are diagnosed with hypertension, you may have other blood or imaging tests to help your health care provider  understand your overall risk for other conditions. How is this treated? This condition is treated by making healthy lifestyle changes, such as eating healthy foods, exercising more, and reducing your alcohol intake. Your health care provider may prescribe medicine if lifestyle changes are not enough to get your blood pressure under control, and if:  Your systolic blood pressure is above 130.  Your diastolic blood pressure is above 80. Your personal target blood pressure may vary depending on your medical conditions, your age, and other factors. Follow these instructions at home: Eating and drinking   Eat a diet that is high in fiber and potassium, and low in sodium, added sugar, and fat. An example eating plan is called the DASH (Dietary Approaches to Stop Hypertension) diet. To eat this way: ? Eat plenty of fresh fruits and vegetables. Try to fill one half of your plate at each meal with fruits and vegetables. ? Eat whole grains, such as whole-wheat pasta, brown rice, or whole-grain bread. Fill about one fourth of your plate with whole grains. ? Eat or drink low-fat dairy products, such as skim milk or low-fat yogurt. ? Avoid fatty cuts of meat, processed or cured meats, and poultry with skin. Fill about one fourth of your plate with lean proteins, such as fish, chicken without skin, beans, eggs, or tofu. ? Avoid pre-made and processed foods. These tend to be higher in sodium, added sugar, and fat.  Reduce your daily sodium intake. Most people with hypertension should eat less than 1,500 mg of sodium a day.  Do not drink alcohol if: ? Your health care provider tells you not to drink. ? You are pregnant, may be pregnant, or are planning to become pregnant.  If you drink alcohol: ? Limit how much you use to:  0-1 drink a day for women.  0-2 drinks a day for men. ? Be aware of how much alcohol is in your drink. In the [place] one drink equals one 12 oz bottle of beer (355 mL), one 5 oz  glass of wine (148 mL), or one 1 oz glass of hard liquor (44 mL). Lifestyle   Work with your health care provider to maintain a healthy body weight or to lose weight. Ask what an ideal weight is for you.  Get at least 30 minutes of exercise most days of the week. Activities may include walking, swimming, or biking.  Include exercise to strengthen your muscles (resistance exercise), such as Pilates or lifting weights, as part of your weekly exercise routine. Try to do these types of exercises for 30 minutes at least 3 days a week.  Do not use any products that contain nicotine or tobacco, such as cigarettes, e-cigarettes, and chewing tobacco. If you need help quitting, ask your health care provider.  Monitor your blood pressure at home as told by your health care provider.  Keep all follow-up visits as told by your health care provider. This is important. Medicines  Take over-the-counter and prescription medicines only as told by your health care provider. Follow directions carefully. Blood pressure medicines must be taken as prescribed.  Do not skip doses of blood pressure medicine. Doing this puts you at risk for problems and can make the medicine less effective.  Ask your health care provider about side effects or reactions to medicines that you should watch for. Contact a health care provider if you:  Think you are having a reaction to a medicine you are taking.  Have headaches that keep coming back (recurring).  Feel dizzy.  Have swelling in your ankles.  Have trouble with your vision. Get help right away if you:  Develop a severe headache or confusion.  Have unusual weakness or numbness.  Feel faint.  Have severe pain in your chest or abdomen.  Vomit repeatedly.  Have trouble breathing. Summary  Hypertension is when the force of blood pumping through your arteries is too strong. If this condition is not controlled, it may put you at risk for serious  complications.  Your personal target blood pressure may vary depending on your medical conditions, your age, and other factors. For most people, a normal blood pressure is less than 120/80.  Hypertension is treated with lifestyle changes, medicines, or a combination of both. Lifestyle changes include losing weight, eating a healthy, low-sodium diet, exercising more, and limiting alcohol. This information is not intended to replace advice given to you by your health care provider. Make sure you discuss any questions you have with your health care provider. Document Revised: 05/26/2018 Document Reviewed: 05/26/2018 Elsevier Patient Education  2020 [employer].

## 2020-02-14 NOTE — Progress Notes (Signed)
GYNECOLOGY ANNUAL PREVENTATIVE CARE ENCOUNTER NOTE  History:     Kayla Bautista is a 29 y.o. G95P1001 female here for a routine annual gynecologic exam and Nexplanon removal.  Current complaints: none.   Denies abnormal vaginal bleeding, discharge, pelvic pain, problems with intercourse or other gynecologic concerns. Pt desires STD testing today, but has [facility] and does not want to pay for additional testing. Pt reports she does not perform SBE. Pt denies bowel or bladder concerns. No family hx of breast, colon, endometrial or ovarian cancer. Pt does not drink or use drugs. Pt reports she smokes and does not desire to quit at this time. Last dental exam: over one year. Last eye exam: about 2 years ago.      Nexplanon was inserted 06/202018 ago at [facility]. Pt is sure of decision to remove Nexplanon. Informed consent document signed. Pt has allergy to Tylenol. Nexplanon easily palpated on inner aspect of left upper arm above biceps in oblique position in muscle. Distal end of Nexplanon marked with sterile marker. Betadine applied to the area.  Lidocaine 1%, 37mL inserted under the distal end of Nexplanon at marked site. Confirmed pt was anesthetized with sharp test of scalpel. With the proximal end pressed down, a small, longitudinal <65mm incision was made with a #11 scalpel, starting at the distal tip of the implant. Once the tip was visualized, the Nexplanon was grasped with curved forceps and removed gently, while maintaining traction on the skin above the proximal end.  Nexplanon removed without difficulty, intact, and shown to patient. Incision was then closed by an adhesive bandage (steri-strips not available in clinic) and then pressure bandage with sterile gauze. Pt instructed to leave pressure dressing on for 24hrs. Pt instructed to wash area with soap and water by hand for the next 5 days.    Gynecologic History No LMP recorded. Patient has had an  implant. Menstruation: irregular Contraception: Nexplanon. Pt desires removal today d/t irregular bleeding and reports she will use condoms. Last Pap: 2018. Results were: normal per EPIC. Pt denies history of abnormal Pap.  Obstetric History OB History  Gravida Para Term Preterm AB Living  1 1 1     1   SAB TAB Ectopic Multiple Live Births          1    # Outcome Date GA Lbr Len/2nd Weight Sex Delivery Anes PTL Lv  1 Term 12/15/13 [redacted]w[redacted]d 24:53 / 00:17 6 lb 0.8 oz (2.744 kg) F Vag-Spont EPI  LIV    Past Medical History:  Diagnosis Date  . Hypertension   . Infection    UTI  . Obesity   . Stye     Past Surgical History:  Procedure Laterality Date  . NO PAST SURGERIES      Current Outpatient Medications on File Prior to Visit  Medication Sig Dispense Refill  . Etonogestrel (IMPLANON [place]) Inject 1 each into the skin once.    [redacted]w[redacted]d ibuprofen (ADVIL,MOTRIN) 600 MG tablet Take 1 tablet (600 mg total) by mouth every 8 (eight) hours. Take every 8 hours for 5 days during vaginal bleeding episodes. (Patient not taking: Reported on 02/14/2020) 15 tablet 2   No current facility-administered medications on file prior to visit.    Allergies  Allergen Reactions  . Tylenol [Acetaminophen] Itching    Throat itches    Social History:  reports that she has been smoking cigarettes. She has a 0.50 pack-year smoking history. She has never used smokeless tobacco. She  reports that she does not drink alcohol or use drugs.  Family History  Problem Relation Age of Onset  . Diabetes Mother   . Diabetes Maternal Grandmother   . Diabetes Maternal Grandfather     The following portions of the patient's history were reviewed and updated as appropriate: allergies, current medications, past family history, past medical history, past social history, past surgical history and problem list.  Review of Systems Pertinent items noted in HPI and remainder of comprehensive ROS otherwise negative.  Physical  Exam:  BP (!) 135/105   Pulse 82   Wt (!) 300 lb 4.8 oz (136.2 kg)   BMI 45.66 kg/m  CONSTITUTIONAL: Well-developed, well-nourished female in no acute distress.  HENT:  Normocephalic, atraumatic, External right and left ear normal. EYES: Conjunctivae and EOM are normal. Pupils are equal, round, and reactive to light. No scleral icterus.  NECK: Normal range of motion, supple, no masses.  Normal thyroid.  SKIN: Skin is warm and dry. No rash noted. Not diaphoretic. No erythema. No pallor. MUSCULOSKELETAL: Normal range of motion. No tenderness.  No cyanosis, clubbing, or edema. NEUROLOGIC: Alert and oriented to person, place, and time. Normal reflexes, muscle tone coordination. PSYCHIATRIC: Normal mood and affect. Normal behavior. Normal judgment and thought content. CARDIOVASCULAR: Normal heart rate noted, regular rhythm. RESPIRATORY: Clear to auscultation bilaterally. Effort and breath sounds normal, no problems with respiration noted. BREASTS: Symmetric in size. No masses, skin changes, nipple drainage, or lymphadenopathy. ABDOMEN: Soft, normal bowel sounds, no distention noted.  No tenderness, rebound or guarding.  PELVIC: Normal appearing external genitalia; normal appearing vaginal mucosa and cervix.  No abnormal discharge noted.  Pap smear obtained.  Normal uterine size, no other palpable masses, no uterine or adnexal tenderness.   Assessment and Plan:     1. Pap smear for cervical cancer screening - Cytology - PAP( [place])  2. Hypertension, unspecified type -PCP list given  3. Well woman exam - Ambulatory referral to Dentistry - Ambulatory referral to Optometry  4. Nexplanon removal - pt tolerated well   Will follow up results of pap smear and other testing, if performed, and manage accordingly. Routine preventative health maintenance measures emphasized. Self-breast awareness taught, importance discussed, advised when to RTC, SBA literature given. Please refer to  After Visit Summary for other counseling recommendations.      Stacey Drain, [facility] Women's Health Nurse Practitioner, [facility] for [employer], [place]

## 2020-02-14 NOTE — Telephone Encounter (Signed)
Pt able to be scheduled for same day appt with provider.

## 2020-02-14 NOTE — Telephone Encounter (Signed)
Received a call from the patient who wanted to reschedule her last appointment that she missed. She stated her phone number was updated a while back, but yet and still the number in Epic was not correct. She wanted to know why we no longer did reminder calls. She wanted a text message sent to her to remind her of the appointment. When I began to tell her about MyChart, she stated everybody don't have access to be able to use a computer. She stated what was she suppose to do for birthcontrol. I informed her I would place her on the wait list, and have a nurse call her to see if they can call her in something. She was very rude, and snappy when I wasn't able to get her in sooner.

## 2020-02-15 LAB — CYTOLOGY - PAP
Adequacy: ABSENT
Diagnosis: NEGATIVE

## 2020-03-22 ENCOUNTER — Ambulatory Visit: Payer: Medicaid Other | Admitting: Obstetrics and Gynecology

## 2020-04-23 ENCOUNTER — Telehealth: Payer: Self-pay | Admitting: Women's Health

## 2020-04-23 NOTE — Telephone Encounter (Signed)
Attempted to contact patient to see if she would still like an appointment to get on the depo injection. No answer and unable to leave a voicemail due to it being full.

## 2021-06-04 ENCOUNTER — Emergency Department (HOSPITAL_COMMUNITY): Payer: Medicaid Other

## 2021-06-04 ENCOUNTER — Encounter (HOSPITAL_COMMUNITY): Payer: Self-pay

## 2021-06-04 ENCOUNTER — Emergency Department (HOSPITAL_COMMUNITY)
Admission: EM | Admit: 2021-06-04 | Discharge: 2021-06-04 | Disposition: A | Discharge status: Home / Self Care | Payer: Medicaid Other | Attending: Emergency Medicine | Admitting: Emergency Medicine

## 2021-06-04 DIAGNOSIS — R1032 Left lower quadrant pain: Secondary | ICD-10-CM | POA: Insufficient documentation

## 2021-06-04 DIAGNOSIS — F17210 Nicotine dependence, cigarettes, uncomplicated: Secondary | ICD-10-CM | POA: Diagnosis not present

## 2021-06-04 DIAGNOSIS — I10 Essential (primary) hypertension: Secondary | ICD-10-CM | POA: Insufficient documentation

## 2021-06-04 DIAGNOSIS — R1030 Lower abdominal pain, unspecified: Secondary | ICD-10-CM

## 2021-06-04 LAB — CBC WITH DIFFERENTIAL/PLATELET
Abs Immature Granulocytes: 0.02 10*3/uL (ref 0.00–0.07)
Basophils Absolute: 0 10*3/uL (ref 0.0–0.1)
Basophils Relative: 0 %
Eosinophils Absolute: 0.3 10*3/uL (ref 0.0–0.5)
Eosinophils Relative: 3 %
HCT: 38 % (ref 36.0–46.0)
Hemoglobin: 12.2 g/dL (ref 12.0–15.0)
Immature Granulocytes: 0 %
Lymphocytes Relative: 17 %
Lymphs Abs: 1.5 10*3/uL (ref 0.7–4.0)
MCH: 29.4 pg (ref 26.0–34.0)
MCHC: 32.1 g/dL (ref 30.0–36.0)
MCV: 91.6 fL (ref 80.0–100.0)
Monocytes Absolute: 0.4 10*3/uL (ref 0.1–1.0)
Monocytes Relative: 4 %
Neutro Abs: 6.4 10*3/uL (ref 1.7–7.7)
Neutrophils Relative %: 76 %
Platelets: 219 10*3/uL (ref 150–400)
RBC: 4.15 MIL/uL (ref 3.87–5.11)
RDW: 13.9 % (ref 11.5–15.5)
WBC: 8.5 10*3/uL (ref 4.0–10.5)
nRBC: 0 % (ref 0.0–0.2)

## 2021-06-04 LAB — COMPREHENSIVE METABOLIC PANEL
ALT: 19 U/L (ref 0–44)
AST: 18 U/L (ref 15–41)
Albumin: 3.7 g/dL (ref 3.5–5.0)
Alkaline Phosphatase: 53 U/L (ref 38–126)
Anion gap: 5 (ref 5–15)
BUN: 12 mg/dL (ref 6–20)
CO2: 28 mmol/L (ref 22–32)
Calcium: 9 mg/dL (ref 8.9–10.3)
Chloride: 106 mmol/L (ref 98–111)
Creatinine, Ser: 0.77 mg/dL (ref 0.44–1.00)
GFR, Estimated: >60 mL/min (ref >60)
Glucose, Bld: 88 mg/dL (ref 70–99)
Potassium: 3.7 mmol/L (ref 3.5–5.1)
Sodium: 139 mmol/L (ref 135–145)
Total Bilirubin: 0.5 mg/dL (ref 0.3–1.2)
Total Protein: 7.1 g/dL (ref 6.5–8.1)

## 2021-06-04 LAB — URINALYSIS, ROUTINE W REFLEX MICROSCOPIC
Bilirubin Urine: NEGATIVE
Glucose, UA: NEGATIVE mg/dL
Ketones, ur: NEGATIVE mg/dL
Nitrite: NEGATIVE
Protein, ur: NEGATIVE mg/dL
Specific Gravity, Urine: 1.025 (ref 1.005–1.030)
pH: 6 (ref 5.0–8.0)

## 2021-06-04 LAB — LIPASE, BLOOD: Lipase: 21 U/L (ref 11–51)

## 2021-06-04 LAB — I-STAT BETA HCG BLOOD, ED (MC, WL, AP ONLY): I-stat hCG, quantitative: <5 m[IU]/mL (ref <5)

## 2021-06-04 LAB — MAGNESIUM: Magnesium: 2 mg/dL (ref 1.7–2.4)

## 2021-06-04 MED ORDER — KETOROLAC TROMETHAMINE 15 MG/ML IJ SOLN
15.0000 mg | Freq: Once | INTRAMUSCULAR | Status: AC
  Administered 2021-06-04: 15 mg via INTRAVENOUS
  Filled 2021-06-04: qty 1

## 2021-06-04 MED ORDER — LACTATED RINGERS IV BOLUS
1000.0000 mL | Freq: Once | INTRAVENOUS | Status: AC
  Administered 2021-06-04: 1000 mL via INTRAVENOUS

## 2021-06-04 MED ORDER — IOHEXOL 350 MG/ML SOLN
75.0000 mL | Freq: Once | INTRAVENOUS | Status: AC | PRN
  Administered 2021-06-04: 75 mL via INTRAVENOUS

## 2021-06-04 NOTE — ED Provider Notes (Signed)
[facility]-EMERGENCY DEPT Provider Note   CSN: [id number] Arrival date & time: 06/04/21  0730     History Chief Complaint  Patient presents with   Abdominal Pain    Kayla Bautista is a 30 y.o. female.   Abdominal Pain Pain location:  LLQ Pain quality: aching and cramping   Pain radiates to:  Does not radiate Pain severity:  Moderate Onset quality:  Gradual Duration:  3 days Timing:  Intermittent Progression:  Waxing and waning Chronicity:  New Context: not trauma   Worsened by:  Movement Associated symptoms: no anorexia, no chest pain, no chills, no constipation, no cough, no diarrhea, no dysuria, no fatigue, no fever, no hematochezia, no hematuria, no nausea, no shortness of breath, no sore throat, no vaginal bleeding, no vaginal discharge and no vomiting       Past Medical History:  Diagnosis Date   Hypertension    Infection    UTI   Obesity    Stye     Patient Active Problem List   Diagnosis Date Noted   BMI 45.0-49.9, adult (HCC) 03/03/2014   Hypertension 03/03/2014   Yeast infection involving the vagina and surrounding area 08/10/2013    Past Surgical History:  Procedure Laterality Date   NO PAST SURGERIES       OB History     Gravida  1   Para  1   Term  1   Preterm      AB      Living  1      SAB      IAB      Ectopic      Multiple      Live Births  1           Family History  Problem Relation Age of Onset   Diabetes Mother    Diabetes Maternal Grandmother    Diabetes Maternal Grandfather     Social History   Tobacco Use   Smoking status: Some Days    Packs/day: 0.25    Years: 2.00    Pack years: 0.50    Types: Cigarettes    Last attempt to quit: 05/04/2013    Years since quitting: 8.0   Smokeless tobacco: Never  Substance Use Topics   Alcohol use: No    Alcohol/week: 0.0 standard drinks    Comment: occ   Drug use: No    Home Medications Prior to Admission medications   Medication  Sig Start Date End Date Taking? Authorizing Provider  metroNIDAZOLE (FLAGYL) 500 MG tablet Take 500 mg by mouth 2 (two) times daily. 05/27/21  Yes [provider]    Allergies    Tylenol [acetaminophen]  Review of Systems   Review of Systems  Constitutional:  Negative for chills, fatigue and fever.  HENT:  Negative for ear pain and sore throat.   Eyes:  Negative for pain and visual disturbance.  Respiratory:  Negative for cough and shortness of breath.   Cardiovascular:  Negative for chest pain and palpitations.  Gastrointestinal:  Positive for abdominal pain. Negative for anorexia, constipation, diarrhea, hematochezia, nausea and vomiting.  Genitourinary:  Negative for dysuria, flank pain, frequency, hematuria, vaginal bleeding, vaginal discharge and vaginal pain.  Musculoskeletal:  Negative for arthralgias, back pain, myalgias and neck pain.  Skin:  Negative for color change and rash.  Neurological:  Negative for dizziness, seizures, syncope and light-headedness.  All other systems reviewed and are negative.  Physical Exam Updated Vital Signs BP Marland Kitchen)  161/100 (BP Location: Right Arm)   Pulse 71   Temp 98.4 F (36.9 C) (Oral)   Resp 16   Ht 5\' 9"  (1.753 m)   Wt (!) 140.6 kg   LMP 05/27/2021 (Approximate) Comment: negative HCG blood test 06-04-2021  SpO2 100%   BMI 45.78 kg/m   Physical Exam Vitals and nursing note reviewed.  Constitutional:      General: She is not in acute distress.    Appearance: She is well-developed. She is obese. She is not ill-appearing, toxic-appearing or diaphoretic.  HENT:     Head: Normocephalic and atraumatic.     Mouth/Throat:     Mouth: Mucous membranes are moist.     Pharynx: Oropharynx is clear.  Eyes:     Conjunctiva/sclera: Conjunctivae normal.  Cardiovascular:     Rate and Rhythm: Normal rate and regular rhythm.     Heart sounds: No murmur heard. Pulmonary:     Effort: Pulmonary effort is normal. No respiratory distress.      Breath sounds: Normal breath sounds.  Abdominal:     Palpations: Abdomen is soft.     Tenderness: There is abdominal tenderness in the left lower quadrant. There is no right CVA tenderness, left CVA tenderness, guarding or rebound.  Musculoskeletal:     Cervical back: Neck supple.  Skin:    General: Skin is warm and dry.  Neurological:     General: No focal deficit present.     Mental Status: She is alert and oriented to person, place, and time.  Psychiatric:        Mood and Affect: Mood normal.        Behavior: Behavior normal.    ED Results / Procedures / Treatments   Labs (all labs ordered are listed, but only abnormal results are displayed) Labs Reviewed  URINALYSIS, ROUTINE W REFLEX MICROSCOPIC - Abnormal; Notable for the following components:      Result Value   Hgb urine dipstick SMALL (*)    Leukocytes,Ua SMALL (*)    Bacteria, UA RARE (*)    All other components within normal limits  COMPREHENSIVE METABOLIC PANEL  LIPASE, BLOOD  CBC WITH DIFFERENTIAL/PLATELET  MAGNESIUM  I-STAT BETA HCG BLOOD, ED ([facility], [facility], [facility] ONLY)    EKG None  Radiology CT ABDOMEN PELVIS W CONTRAST  Result Date: 06/04/2021 CLINICAL DATA:  Lower abdominal pain since Saturday. EXAM: CT ABDOMEN AND PELVIS WITH CONTRAST TECHNIQUE: Multidetector CT imaging of the abdomen and pelvis was performed using the standard protocol following bolus administration of intravenous contrast. CONTRAST:  43mL OMNIPAQUE IOHEXOL 350 MG/ML SOLN COMPARISON:  None. FINDINGS: Lower chest: Unremarkable. Hepatobiliary: No suspicious focal abnormality within the liver parenchyma. No intrahepatic or extrahepatic biliary dilation. There is no evidence for gallstones, gallbladder wall thickening, or pericholecystic fluid. Pancreas: No focal mass lesion. No dilatation of the main duct. No intraparenchymal cyst. No peripancreatic edema. Spleen: No splenomegaly. No focal mass lesion. Adrenals/Urinary Tract: No adrenal nodule or mass.  Kidneys unremarkable. No evidence for hydroureter. The urinary bladder appears normal for the degree of distention. Stomach/Bowel: Stomach is unremarkable. No gastric wall thickening. No evidence of outlet obstruction. Duodenum is normally positioned as is the ligament of Treitz. No small bowel wall thickening. No small bowel dilatation. The terminal ileum is normal. The appendix is normal. No gross colonic mass. No colonic wall thickening. Vascular/Lymphatic: No abdominal aortic aneurysm. There is no gastrohepatic or hepatoduodenal ligament lymphadenopathy. No retroperitoneal or mesenteric lymphadenopathy. No pelvic sidewall lymphadenopathy. Reproductive: The  uterus is unremarkable.  There is no adnexal mass. Other: No intraperitoneal free fluid. Musculoskeletal: No worrisome lytic or sclerotic osseous abnormality. IMPRESSION: No acute findings in the abdomen or pelvis. Specifically, no findings to explain the patient's history of lower abdominal pain. Electronically Signed   By: Kennith Center M.D.   On: 06/04/2021 11:01    Procedures Procedures   Medications Ordered in ED Medications  ketorolac (TORADOL) 15 MG/ML injection 15 mg (15 mg Intravenous Given 06/04/21 0849)  lactated ringers bolus 1,000 mL (0 mLs Intravenous Stopped 06/04/21 1206)  iohexol (OMNIPAQUE) 350 MG/ML injection 75 mL (75 mLs Intravenous Contrast Given 06/04/21 1029)    ED Course  I have reviewed the triage vital signs and the nursing notes.  Pertinent labs & imaging results that were available during my care of the patient were reviewed by me and considered in my medical decision making (see chart for details).    MDM Rules/Calculators/A&P                           Patient is a 30 year old female who presents for 3 days of left lower quadrant abdominal pain.  She denies any recent symptoms of dysuria.  She reports that 4 days ago, she did go to [facility] for a scheduled Pap smear.  At the time, they did not perform a  Pap smear.  They did the swab her vaginal vault and diagnosed her with bacterial vaginosis.  Since that time, she has been on Flagyl.  She has a follow-up appointment scheduled with them this afternoon.  Patient reports that after this visit, she did develop left lower quadrant abdominal pain.  She has had normal bowel movements, including this morning.  On arrival, vital signs notable for hypertension, but otherwise normal.  On exam, patient does have tenderness in the lower left aspect of her abdomen.  Labs and CT scan were ordered.  Patient was given bolus of IVF and Toradol for analgesia.  Labs showed no leukocytosis.  Urinalysis was equivocal.  Given absence of urinary symptoms, no treatment for UTI was indicated at this time.  CT scan did not show any acute findings to explain her abdominal pain.  I spoke with patient early on regarding diagnostic pathway.  I explained that, in the absence of CT scan findings, the neck step would be a pelvic exam.  Following the negative CT scan, patient was offered pelvic exam.  She declined, given that she just had one 4 days ago.  At this point, patient stated that she would like to be discharged in order to make her scheduled appointment with [facility].  Given that she has this follow-up appointment at her outpatient clinic, I feel this is reasonable.  Patient was encouraged to return to the ED for any worsening symptoms.  She was discharged stable condition.  Final Clinical Impression(s) / ED Diagnoses Final diagnoses:  Lower abdominal pain    Rx / DC Orders ED Discharge Orders     None        Gloris Manchester, MD 06/04/21 1752

## 2021-06-04 NOTE — ED Notes (Signed)
Pt standing at doorway awaiting discharge. This RN brought discharge papers to door and pt took papers from my hand and said "I can read it" and exited the emergency room. Attempted to get vital signs on pt and pt said "no".

## 2021-06-04 NOTE — ED Triage Notes (Signed)
Pt presents to ED c/o lower abdominal pain, cramps, pressure since Saturday. Denies NVD or urinary sx.

## 2021-06-04 NOTE — Discharge Instructions (Addendum)
Keep your scheduled appointment for later today.  Please return to the ED for worsening symptoms.

## 2021-06-04 NOTE — ED Triage Notes (Signed)
Pt states she tested positive for gonorrhea last week but was not prescribed medication for it.

## 2021-10-14 ENCOUNTER — Ambulatory Visit (INDEPENDENT_AMBULATORY_CARE_PROVIDER_SITE_OTHER): Payer: Medicaid Other | Admitting: Podiatry

## 2021-10-14 ENCOUNTER — Ambulatory Visit (INDEPENDENT_AMBULATORY_CARE_PROVIDER_SITE_OTHER): Payer: Medicaid Other

## 2021-10-14 ENCOUNTER — Other Ambulatory Visit: Payer: Self-pay

## 2021-10-14 DIAGNOSIS — M722 Plantar fascial fibromatosis: Secondary | ICD-10-CM

## 2021-10-14 DIAGNOSIS — M2141 Flat foot [pes planus] (acquired), right foot: Secondary | ICD-10-CM | POA: Diagnosis not present

## 2021-10-14 DIAGNOSIS — M7731 Calcaneal spur, right foot: Secondary | ICD-10-CM

## 2021-10-14 DIAGNOSIS — M2142 Flat foot [pes planus] (acquired), left foot: Secondary | ICD-10-CM

## 2021-10-14 DIAGNOSIS — M79671 Pain in right foot: Secondary | ICD-10-CM

## 2021-10-14 MED ORDER — MELOXICAM 15 MG PO TABS: 15.0000 mg | ORAL_TABLET | Freq: Every day | ORAL | 0 refills | Status: DC | PRN

## 2021-10-14 NOTE — Patient Instructions (Signed)
For instructions on how to put on your Plantar Fascial Brace, please visit [email or web address]   Plantar Fasciitis (Heel Spur Syndrome) with Rehab The plantar fascia is a fibrous, ligament-like, soft-tissue structure that spans the bottom of the foot. Plantar fasciitis is a condition that causes pain in the foot due to inflammation of the tissue. SYMPTOMS   Pain and tenderness on the underneath side of the foot.  Pain that worsens with standing or walking. CAUSES  Plantar fasciitis is caused by irritation and injury to the plantar fascia on the underneath side of the foot. Common mechanisms of injury include:  Direct trauma to bottom of the foot.  Damage to a small nerve that runs under the foot where the main fascia attaches to the heel bone.  Stress placed on the plantar fascia due to bone spurs. RISK INCREASES WITH:   Activities that place stress on the plantar fascia (running, jumping, pivoting, or cutting).  Poor strength and flexibility.  Improperly fitted shoes.  Tight calf muscles.  Flat feet.  Failure to warm-up properly before activity.  Obesity. PREVENTION  Warm up and stretch properly before activity.  Allow for adequate recovery between workouts.  Maintain physical fitness:  Strength, flexibility, and endurance.  Cardiovascular fitness.  Maintain a health body weight.  Avoid stress on the plantar fascia.  Wear properly fitted shoes, including arch supports for individuals who have flat feet.  PROGNOSIS  If treated properly, then the symptoms of plantar fasciitis usually resolve without surgery. However, occasionally surgery is necessary.  RELATED COMPLICATIONS   Recurrent symptoms that may result in a chronic condition.  Problems of the lower back that are caused by compensating for the injury, such as limping.  Pain or weakness of the foot during push-off following surgery.  Chronic inflammation, scarring, and partial or complete  fascia tear, occurring more often from repeated injections.  TREATMENT  Treatment initially involves the use of ice and medication to help reduce pain and inflammation. The use of strengthening and stretching exercises may help reduce pain with activity, especially stretches of the Achilles tendon. These exercises may be performed at home or with a therapist. Your caregiver may recommend that you use heel cups of arch supports to help reduce stress on the plantar fascia. Occasionally, corticosteroid injections are given to reduce inflammation. If symptoms persist for greater than 6 months despite non-surgical (conservative), then surgery may be recommended.   MEDICATION   If pain medication is necessary, then nonsteroidal anti-inflammatory medications, such as aspirin and ibuprofen, or other minor pain relievers, such as acetaminophen, are often recommended.  Do not take pain medication within 7 days before surgery.  Prescription pain relievers may be given if deemed necessary by your caregiver. Use only as directed and only as much as you need.  Corticosteroid injections may be given by your caregiver. These injections should be reserved for the most serious cases, because they may only be given a certain number of times.  HEAT AND COLD  Cold treatment (icing) relieves pain and reduces inflammation. Cold treatment should be applied for 10 to 15 minutes every 2 to 3 hours for inflammation and pain and immediately after any activity that aggravates your symptoms. Use ice packs or massage the area with a piece of ice (ice massage).  Heat treatment may be used prior to performing the stretching and strengthening activities prescribed by your caregiver, physical therapist, or athletic trainer. Use a heat pack or soak the injury in warm water.  SEEK IMMEDIATE MEDICAL   CARE IF:  Treatment seems to offer no benefit, or the condition worsens.  Any medications produce adverse side effects.   EXERCISES- RANGE OF MOTION (ROM) AND STRETCHING EXERCISES - Plantar Fasciitis (Heel Spur Syndrome) These exercises may help you when beginning to rehabilitate your injury. Your symptoms may resolve with or without further involvement from your physician, physical therapist or athletic trainer. While completing these exercises, remember:   Restoring tissue flexibility helps normal motion to return to the joints. This allows healthier, less painful movement and activity.  An effective stretch should be held for at least 30 seconds.  A stretch should never be painful. You should only feel a gentle lengthening or release in the stretched tissue.  RANGE OF MOTION - Toe Extension, Flexion  Sit with your right / left leg crossed over your opposite knee.  Grasp your toes and gently pull them back toward the top of your foot. You should feel a stretch on the bottom of your toes and/or foot.  Hold this stretch for 10 seconds.  Now, gently pull your toes toward the bottom of your foot. You should feel a stretch on the top of your toes and or foot.  Hold this stretch for 10 seconds. Repeat  times. Complete this stretch 3 times per day.   RANGE OF MOTION - Ankle Dorsiflexion, Active Assisted  Remove shoes and sit on a chair that is preferably not on a carpeted surface.  Place right / left foot under knee. Extend your opposite leg for support.  Keeping your heel down, slide your right / left foot back toward the chair until you feel a stretch at your ankle or calf. If you do not feel a stretch, slide your bottom forward to the edge of the chair, while still keeping your heel down.  Hold this stretch for 10 seconds. Repeat 3 times. Complete this stretch 2 times per day.   STRETCH  Gastroc, Standing  Place hands on wall.  Extend right / left leg, keeping the front knee somewhat bent.  Slightly point your toes inward on your back foot.  Keeping your right / left heel on the floor and your  knee straight, shift your weight toward the wall, not allowing your back to arch.  You should feel a gentle stretch in the right / left calf. Hold this position for 10 seconds. Repeat 3 times. Complete this stretch 2 times per day.  STRETCH  Soleus, Standing  Place hands on wall.  Extend right / left leg, keeping the other knee somewhat bent.  Slightly point your toes inward on your back foot.  Keep your right / left heel on the floor, bend your back knee, and slightly shift your weight over the back leg so that you feel a gentle stretch deep in your back calf.  Hold this position for 10 seconds. Repeat 3 times. Complete this stretch 2 times per day.  STRETCH  Gastrocsoleus, Standing  Note: This exercise can place a lot of stress on your foot and ankle. Please complete this exercise only if specifically instructed by your caregiver.   Place the ball of your right / left foot on a step, keeping your other foot firmly on the same step.  Hold on to the wall or a rail for balance.  Slowly lift your other foot, allowing your body weight to press your heel down over the edge of the step.  You should feel a stretch in your right / left calf.  Hold this   position for 10 seconds.  Repeat this exercise with a slight bend in your right / left knee. Repeat 3 times. Complete this stretch 2 times per day.   STRENGTHENING EXERCISES - Plantar Fasciitis (Heel Spur Syndrome)  These exercises may help you when beginning to rehabilitate your injury. They may resolve your symptoms with or without further involvement from your physician, physical therapist or athletic trainer. While completing these exercises, remember:   Muscles can gain both the endurance and the strength needed for everyday activities through controlled exercises.  Complete these exercises as instructed by your physician, physical therapist or athletic trainer. Progress the resistance and repetitions only as guided.  STRENGTH -  Towel Curls  Sit in a chair positioned on a non-carpeted surface.  Place your foot on a towel, keeping your heel on the floor.  Pull the towel toward your heel by only curling your toes. Keep your heel on the floor. Repeat 3 times. Complete this exercise 2 times per day.  STRENGTH - Ankle Inversion  Secure one end of a rubber exercise band/tubing to a fixed object (table, pole). Loop the other end around your foot just before your toes.  Place your fists between your knees. This will focus your strengthening at your ankle.  Slowly, pull your big toe up and in, making sure the band/tubing is positioned to resist the entire motion.  Hold this position for 10 seconds.  Have your muscles resist the band/tubing as it slowly pulls your foot back to the starting position. Repeat 3 times. Complete this exercises 2 times per day.  Document Released: 09/15/2005 Document Revised: 12/08/2011 Document Reviewed: 12/28/2008 [employer] Patient Information 2014 [employer]. 

## 2021-10-17 ENCOUNTER — Ambulatory Visit
Admission: RE | Admit: 2021-10-17 | Discharge: 2021-10-17 | Disposition: A | Discharge status: Home / Self Care | Payer: Medicaid Other | Source: Ambulatory Visit | Attending: Internal Medicine | Admitting: Internal Medicine

## 2021-10-17 VITALS — BP 142/84 | HR 90 | Temp 98.1°F | Resp 16

## 2021-10-17 DIAGNOSIS — Z113 Encounter for screening for infections with a predominantly sexual mode of transmission: Secondary | ICD-10-CM | POA: Diagnosis not present

## 2021-10-17 DIAGNOSIS — Z3202 Encounter for pregnancy test, result negative: Secondary | ICD-10-CM

## 2021-10-17 DIAGNOSIS — Z202 Contact with and (suspected) exposure to infections with a predominantly sexual mode of transmission: Secondary | ICD-10-CM | POA: Diagnosis not present

## 2021-10-17 LAB — POCT URINE PREGNANCY: Preg Test, Ur: NEGATIVE

## 2021-10-17 MED ORDER — CEFTRIAXONE SODIUM 1 G IJ SOLR
0.5000 g | Freq: Once | INTRAMUSCULAR | Status: AC
  Administered 2021-10-17: 0.5 g via INTRAMUSCULAR

## 2021-10-17 MED ORDER — DOXYCYCLINE HYCLATE 100 MG PO CAPS: 100.0000 mg | ORAL_CAPSULE | Freq: Two times a day (BID) | ORAL | 0 refills | Status: DC

## 2021-10-17 NOTE — Progress Notes (Signed)
Subjective:   Patient ID: Kayla Bautista, female   DOB: 31 y.o.   MRN: [id number]   HPI 31 year old female presents the office today for concerns of right heel pain.  She states started about a month ago.  She describes a sharp pain in the bottom of her heel.  No radiating pain.  She states is not hurting as much and she has not been doing much walking.  No recent injury.  No recent treatment.  She has no other concerns.   Review of Systems  All other systems reviewed and are negative.  Past Medical History:  Diagnosis Date   Hypertension    Infection    UTI   Obesity    Stye     Past Surgical History:  Procedure Laterality Date   NO PAST SURGERIES       Current Outpatient Medications:    meloxicam (MOBIC) 15 MG tablet, Take 1 tablet (15 mg total) by mouth daily as needed for pain., Disp: 14 tablet, Rfl: 0   metroNIDAZOLE (FLAGYL) 500 MG tablet, Take 500 mg by mouth 2 (two) times daily., Disp: , Rfl:   Allergies  Allergen Reactions   Tylenol [Acetaminophen] Itching    Throat itches          Objective:  Physical Exam  General: AAO x3, NAD  Dermatological: Skin is warm, dry and supple bilateral. There are no open sores, no preulcerative lesions, no rash or signs of infection present.  Vascular: Dorsalis Pedis artery and Posterior Tibial artery pedal pulses are 2/4 bilateral with immedate capillary fill time. There is no pain with calf compression, swelling, warmth, erythema.   Neruologic: Grossly intact via light touch bilateral.  Negative Tinel sign.  Musculoskeletal: Tenderness palpation on the plantar medial tubercle and plantar central aspect of the right heel insertion of the plantar fascial.  There is no pain with lateral compression of calcaneus.  No pain with Achilles tendon.  Flexor, extensor tendons.  Intact.  Muscular strength 5/5 in all groups tested bilateral.  Flatfoot is present.  Gait: Unassisted, Nonantalgic.       Assessment:   31 year old  female with right heel pain, Plantar fasciitis     Plan:  -Treatment options discussed including all alternatives, risks, and complications -Etiology of symptoms were discussed -X-rays were obtained and reviewed with the patient.  Decreased calcaneal inclination angle.  Calcaneal spurring present.  Evidence of acute fracture. -Prescribed mobic. Discussed side effects of the medication and directed to stop if any are to occur and call the office.  -Plantar fascial brace dispensed -Discussed stretching, icing daily.  Discussed shoe modifications good arch support.  Trula Slade DPM

## 2021-10-17 NOTE — Discharge Instructions (Addendum)
You are being treated for gonorrhea and chlamydia exposure.  Antibiotic injection has been administered in urgent care that will treat gonorrhea.  Antibiotic pills have been sent to pharmacy that will treat chlamydia.  Your vaginal swab is pending.  We will call if it is positive.  Urine pregnancy was negative.

## 2021-10-17 NOTE — ED Triage Notes (Signed)
Here for STD check, requesting blood work and pregnancy test

## 2021-10-17 NOTE — ED Provider Notes (Signed)
[facility]    CSN: [id number] Arrival date & time: 10/17/21  1347      History   Chief Complaint Chief Complaint  Patient presents with   SEXUALLY TRANSMITTED DISEASE    HPI Kayla Bautista is a 31 y.o. female.   Patient presents for STD testing.  She reports that her recent sexual partner told her that he tested positive for gonorrhea and chlamydia.  She denies any current symptoms including vaginal discharge, urinary burning, urinary frequency, pelvic pain, abdominal pain, fever, back pain.  She is also requesting a pregnancy test.  She is currently on her menstrual cycle at this time.    Past Medical History:  Diagnosis Date   Hypertension    Infection    UTI   Obesity    Stye     Patient Active Problem List   Diagnosis Date Noted   BMI 45.0-49.9, adult ([facility]) 03/03/2014   Hypertension 03/03/2014   Yeast infection involving the vagina and surrounding area 08/10/2013    Past Surgical History:  Procedure Laterality Date   NO PAST SURGERIES      OB History     Gravida  1   Para  1   Term  1   Preterm      AB      Living  1      SAB      IAB      Ectopic      Multiple      Live Births  1            Home Medications    Prior to Admission medications   Medication Sig Start Date End Date Taking? Authorizing Provider  doxycycline (VIBRAMYCIN) 100 MG capsule Take 1 capsule (100 mg total) by mouth 2 (two) times daily. 10/17/21  Yes Aryam Zhan, Acie Fredrickson, FNP  meloxicam (MOBIC) 15 MG tablet Take 1 tablet (15 mg total) by mouth daily as needed for pain. 10/14/21 10/14/22  Vivi Barrack, DPM  metroNIDAZOLE (FLAGYL) 500 MG tablet Take 500 mg by mouth 2 (two) times daily. 05/27/21   [provider]    Family History Family History  Problem Relation Age of Onset   Diabetes Mother    Diabetes Maternal Grandmother    Diabetes Maternal Grandfather     Social History Social History   Tobacco Use   Smoking status: Some  Days    Packs/day: 0.25    Years: 2.00    Pack years: 0.50    Types: Cigarettes    Last attempt to quit: 05/04/2013    Years since quitting: 8.4   Smokeless tobacco: Never  Substance Use Topics   Alcohol use: No    Alcohol/week: 0.0 standard drinks    Comment: occ   Drug use: No     Allergies   Tylenol [acetaminophen]   Review of Systems Review of Systems Per HPI  Physical Exam Triage Vital Signs ED Triage Vitals [10/17/21 1423]  Enc Vitals Group     BP (!) 142/84     Pulse Rate 90     Resp 16     Temp 98.1 F (36.7 C)     Temp Source Oral     SpO2 98 %     Weight      Height      Head Circumference      Peak Flow      Pain Score 0     Pain Loc  Pain Edu?      Excl. in [place]?    No data found.  Updated Vital Signs BP (!) 142/84 (BP Location: Left Arm)    Pulse 90    Temp 98.1 F (36.7 C) (Oral)    Resp 16    SpO2 98%   Visual Acuity Right Eye Distance:   Left Eye Distance:   Bilateral Distance:    Right Eye Near:   Left Eye Near:    Bilateral Near:     Physical Exam Constitutional:      General: She is not in acute distress.    Appearance: Normal appearance. She is not toxic-appearing or diaphoretic.  HENT:     Head: Normocephalic and atraumatic.  Eyes:     Extraocular Movements: Extraocular movements intact.     Conjunctiva/sclera: Conjunctivae normal.  Pulmonary:     Effort: Pulmonary effort is normal.  Genitourinary:    Comments: Deferred with shared decision making.  Self swab performed. Neurological:     General: No focal deficit present.     Mental Status: She is alert and oriented to person, place, and time. Mental status is at baseline.  Psychiatric:        Mood and Affect: Mood normal.        Behavior: Behavior normal.        Thought Content: Thought content normal.        Judgment: Judgment normal.     UC Treatments / Results  Labs (all labs ordered are listed, but only abnormal results are displayed) Labs Reviewed  POCT  URINE PREGNANCY  CERVICOVAGINAL ANCILLARY ONLY    EKG   Radiology No results found.  Procedures Procedures (including critical care time)  Medications Ordered in UC Medications  cefTRIAXone (ROCEPHIN) injection 0.5 g (has no administration in time range)    Initial Impression / Assessment and Plan / UC Course  I have reviewed the triage vital signs and the nursing notes.  Pertinent labs & imaging results that were available during my care of the patient were reviewed by me and considered in my medical decision making (see chart for details).     Will empirically treat for gonorrhea with IM Rocephin and chlamydia with doxycycline antibiotic given patient's exposure.  Cervicovaginal swab pending.  Urine pregnancy test completed that was negative per patient request.  Advised patient that it is not warranted given that she is on her menstrual cycle, but patient wanted to have test completed.  Patient to refrain from sexual activity until test results and treatment are complete.  Patient verbalized understanding and was agreeable with plan. Final Clinical Impressions(s) / UC Diagnoses   Final diagnoses:  Screening examination for venereal disease  Exposure to gonorrhea  Exposure to chlamydia     Discharge Instructions      You are being treated for gonorrhea and chlamydia exposure.  Antibiotic injection has been administered in urgent care that will treat gonorrhea.  Antibiotic pills have been sent to pharmacy that will treat chlamydia.  Your vaginal swab is pending.  We will call if it is positive.  Urine pregnancy was negative.    ED Prescriptions     Medication Sig Dispense Auth. Provider   doxycycline (VIBRAMYCIN) 100 MG capsule Take 1 capsule (100 mg total) by mouth 2 (two) times daily. 20 capsule Gustavus Bryant, [place]      PDMP not reviewed this encounter.   Gustavus Bryant, [place] 10/17/21 [phone]

## 2021-10-18 ENCOUNTER — Telehealth (HOSPITAL_COMMUNITY): Payer: Self-pay | Admitting: Emergency Medicine

## 2021-10-18 LAB — CERVICOVAGINAL ANCILLARY ONLY
Bacterial Vaginitis (gardnerella): POSITIVE — AB
Candida Glabrata: NEGATIVE
Candida Vaginitis: NEGATIVE
Chlamydia: NEGATIVE
Comment: NEGATIVE
Comment: NEGATIVE
Comment: NEGATIVE
Comment: NEGATIVE
Comment: NEGATIVE
Comment: NORMAL
Neisseria Gonorrhea: NEGATIVE
Trichomonas: NEGATIVE

## 2021-10-18 MED ORDER — METRONIDAZOLE 500 MG PO TABS: 500.0000 mg | ORAL_TABLET | Freq: Two times a day (BID) | ORAL | 0 refills | Status: DC

## 2021-10-29 ENCOUNTER — Other Ambulatory Visit: Payer: Self-pay | Admitting: Podiatry

## 2021-10-29 DIAGNOSIS — M722 Plantar fascial fibromatosis: Secondary | ICD-10-CM

## 2022-01-16 ENCOUNTER — Ambulatory Visit: Payer: Self-pay

## 2022-02-09 ENCOUNTER — Ambulatory Visit
Admission: RE | Admit: 2022-02-09 | Discharge: 2022-02-09 | Disposition: A | Discharge status: Home / Self Care | Payer: Medicaid Other | Source: Ambulatory Visit | Attending: Urgent Care | Admitting: Urgent Care

## 2022-02-09 ENCOUNTER — Other Ambulatory Visit: Payer: Self-pay

## 2022-02-09 VITALS — BP 160/91 | HR 95 | Temp 98.3°F | Resp 18

## 2022-02-09 DIAGNOSIS — R300 Dysuria: Secondary | ICD-10-CM | POA: Insufficient documentation

## 2022-02-09 DIAGNOSIS — N644 Mastodynia: Secondary | ICD-10-CM | POA: Diagnosis not present

## 2022-02-09 DIAGNOSIS — N760 Acute vaginitis: Secondary | ICD-10-CM | POA: Diagnosis present

## 2022-02-09 LAB — POCT URINALYSIS DIP (MANUAL ENTRY)
Bilirubin, UA: NEGATIVE
Glucose, UA: NEGATIVE mg/dL
Ketones, POC UA: NEGATIVE mg/dL
Leukocytes, UA: NEGATIVE
Nitrite, UA: NEGATIVE
Protein Ur, POC: NEGATIVE mg/dL
Spec Grav, UA: 1.02 (ref 1.010–1.025)
Urobilinogen, UA: 0.2 E.U./dL
pH, UA: 7 (ref 5.0–8.0)

## 2022-02-09 LAB — POCT URINE PREGNANCY: Preg Test, Ur: NEGATIVE

## 2022-02-09 NOTE — ED Provider Notes (Signed)
?[facility] ? ? ? ?CSN: [id number] ?Arrival date & time: 02/09/22  [id number] ? ? ?  ? ?History   ?Chief Complaint ?Chief Complaint  ?Patient presents with  ? Exposure to STD  ?  Entered by patient  ? Appointment  ?  0900  ? ? ?HPI ?Kayla Bautista is a 31 y.o. female.  ? ?Pleasant 31yo female presents today with concerns of a 1 week hx of vaginal discharge. She reports increase in quantity, but otherwise denies smell or abnormal color. She denies pelvic pain. She states when in started, she was having dark urine with a "twinge" of discomfort, now just feels like she is not completely emptying her bladder. No blood in urine. No vaginal itching. LNMP was 01/17/22. Is not on contraception therapy. Reports mild breast tenderness in the mornings over the past few days. ? ? ?Exposure to STD ? ? ?Past Medical History:  ?Diagnosis Date  ? Hypertension   ? Infection   ? UTI  ? Obesity   ? Stye   ? ? ?Patient Active Problem List  ? Diagnosis Date Noted  ? BMI 45.0-49.9, adult ([place]) 03/03/2014  ? Hypertension 03/03/2014  ? Yeast infection involving the vagina and surrounding area 08/10/2013  ? ? ?Past Surgical History:  ?Procedure Laterality Date  ? NO PAST SURGERIES    ? ? ?OB History   ? ? Gravida  ?1  ? Para  ?1  ? Term  ?1  ? Preterm  ?   ? AB  ?   ? Living  ?1  ?  ? ? SAB  ?   ? IAB  ?   ? Ectopic  ?   ? Multiple  ?   ? Live Births  ?1  ?   ?  ?  ? ? ? ?Home Medications   ? ?Prior to Admission medications   ?Medication Sig Start Date End Date Taking? Authorizing Provider  ?meloxicam (MOBIC) 15 MG tablet Take 1 tablet (15 mg total) by mouth daily as needed for pain. 10/14/21 10/14/22  Trula Slade, DPM  ? ? ?Family History ?Family History  ?Problem Relation Age of Onset  ? Diabetes Mother   ? Diabetes Maternal Grandmother   ? Diabetes Maternal Grandfather   ? ? ?Social History ?Social History  ? ?Tobacco Use  ? Smoking status: Some Days  ?  Packs/day: 0.25  ?  Years: 2.00  ?  Pack years: 0.50  ?  Types:  Cigarettes  ?  Last attempt to quit: 05/04/2013  ?  Years since quitting: 8.7  ? Smokeless tobacco: Never  ?Substance Use Topics  ? Alcohol use: No  ?  Alcohol/week: 0.0 standard drinks  ?  Comment: occ  ? Drug use: No  ? ? ? ?Allergies   ?Tylenol [acetaminophen] ? ? ?Review of Systems ?Review of Systems ?As per HPI ? ?Physical Exam ?Triage Vital Signs ?ED Triage Vitals  ?Enc Vitals Group  ?   BP 02/09/22 0921 (!) 160/91  ?   Pulse Rate 02/09/22 0921 95  ?   Resp 02/09/22 0921 18  ?   Temp 02/09/22 0921 98.3 ?F (36.8 ?C)  ?   Temp Source 02/09/22 0921 Oral  ?   SpO2 02/09/22 0921 99 %  ?   Weight --   ?   Height --   ?   Head Circumference --   ?   Peak Flow --   ?   Pain Score 02/09/22 0922 0  ?  Pain Loc --   ?   Pain Edu? --   ?   Excl. in [place]? --   ? ?No data found. ? ?Updated Vital Signs ?BP (!) 160/91 (BP Location: Left Arm)   Pulse 95   Temp 98.3 ?F (36.8 ?C) (Oral)   Resp 18   SpO2 99%  ? ?Visual Acuity ?Right Eye Distance:   ?Left Eye Distance:   ?Bilateral Distance:   ? ?Right Eye Near:   ?Left Eye Near:    ?Bilateral Near:    ? ?Physical Exam ?Vitals and nursing note reviewed.  ?Constitutional:   ?   General: She is not in acute distress. ?   Appearance: She is well-developed. She is obese. She is not ill-appearing, toxic-appearing or diaphoretic.  ?HENT:  ?   Head: Normocephalic and atraumatic.  ?Eyes:  ?   Conjunctiva/sclera: Conjunctivae normal.  ?Cardiovascular:  ?   Rate and Rhythm: Normal rate.  ?   Heart sounds: No murmur heard. ?Pulmonary:  ?   Effort: Pulmonary effort is normal. No respiratory distress.  ?Abdominal:  ?   General: Abdomen is flat. There is no distension.  ?   Palpations: Abdomen is soft. There is no mass.  ?   Tenderness: There is no abdominal tenderness. There is no right CVA tenderness, left CVA tenderness, guarding or rebound.  ?Musculoskeletal:     ?   General: No swelling.  ?   Cervical back: Neck supple.  ?Skin: ?   General: Skin is warm and dry.  ?   Capillary Refill:  Capillary refill takes less than 2 seconds.  ?   Findings: No erythema or rash.  ?Neurological:  ?   Mental Status: She is alert.  ?Psychiatric:     ?   Mood and Affect: Mood normal.  ? ? ? ?UC Treatments / Results  ?Labs ?(all labs ordered are listed, but only abnormal results are displayed) ?Labs Reviewed  ?POCT URINALYSIS DIP (MANUAL ENTRY) - Abnormal; Notable for the following components:  ?    Result Value  ? Blood, UA small (*)   ? All other components within normal limits  ?POCT URINE PREGNANCY  ?CERVICOVAGINAL ANCILLARY ONLY  ? ? ?EKG ? ? ?Radiology ?No results found. ? ?Procedures ?Procedures (including critical care time) ? ?Medications Ordered in UC ?Medications - No data to display ? ?Initial Impression / Assessment and Plan / UC Course  ?I have reviewed the triage vital signs and the nursing notes. ? ?Pertinent labs & imaging results that were available during my care of the patient were reviewed by me and considered in my medical decision making (see chart for details). ? ?  ? ?Vaginitis - will await results of aptima swab prior to initiating therapy. Given breast tenderness, question physiologic changes ?Dysuria - UA reveals small blood, otherwise negative. Continue increased water consumption, f/u with PCP if it continues ?Breast tenderness - pregnancy test negative. May be too early to detect if truly positive. Recommended pt retest if no menses by May 21. ? ?Final Clinical Impressions(s) / UC Diagnoses  ? ?Final diagnoses:  ?Vaginitis and vulvovaginitis  ?Dysuria  ?Breast tenderness in female  ? ? ? ?Discharge Instructions   ? ?  ?You were tested today for gonorrhea, chlamydia, trichomonas, BV, and yeast. ?Your pregnancy test is negative and your urine does not reveal infection. ?We will call you with the results of your test once received. ?Please avoid all forms of intercourse until test results received, and if  positive for any STI, all partners will need to complete entire course of antibiotics  prior to resuming. ?As always, practice safer sexual practices by using protection each and every time, and limiting number of partners.  ? ? ? ? ?ED Prescriptions   ?None ?  ? ?PDMP not reviewed this encounter. ?  Chaney Malling, [place] ?02/09/22 [id number] ? ?

## 2022-02-09 NOTE — Discharge Instructions (Signed)
You were tested today for gonorrhea, chlamydia, trichomonas, BV, and yeast. ?Your pregnancy test is negative and your urine does not reveal infection. ?We will call you with the results of your test once received. ?Please avoid all forms of intercourse until test results received, and if positive for any STI, all partners will need to complete entire course of antibiotics prior to resuming. ?As always, practice safer sexual practices by using protection each and every time, and limiting number of partners.  ?

## 2022-02-09 NOTE — ED Triage Notes (Signed)
Pt here for STD check for increased vaginal discharge; pt sts some breast tenderness and sts LMP was 4/21 ?

## 2022-02-10 LAB — CERVICOVAGINAL ANCILLARY ONLY
Bacterial Vaginitis (gardnerella): POSITIVE — AB
Candida Glabrata: NEGATIVE
Candida Vaginitis: NEGATIVE
Chlamydia: NEGATIVE
Comment: NEGATIVE
Comment: NEGATIVE
Comment: NEGATIVE
Comment: NEGATIVE
Comment: NEGATIVE
Comment: NORMAL
Neisseria Gonorrhea: NEGATIVE
Trichomonas: NEGATIVE

## 2022-02-11 ENCOUNTER — Telehealth (HOSPITAL_COMMUNITY): Payer: Self-pay | Admitting: Emergency Medicine

## 2022-02-11 MED ORDER — METRONIDAZOLE 500 MG PO TABS: 500.0000 mg | ORAL_TABLET | Freq: Two times a day (BID) | ORAL | 0 refills | Status: DC

## 2022-04-03 ENCOUNTER — Encounter (HOSPITAL_COMMUNITY): Payer: Self-pay

## 2022-04-03 ENCOUNTER — Emergency Department (HOSPITAL_COMMUNITY)
Admission: EM | Admit: 2022-04-03 | Discharge: 2022-04-03 | Discharge status: Against Medical Advice | Payer: Medicaid Other | Attending: Emergency Medicine | Admitting: Emergency Medicine

## 2022-04-03 ENCOUNTER — Ambulatory Visit
Admission: EM | Admit: 2022-04-03 | Discharge: 2022-04-03 | Disposition: A | Discharge status: Home / Self Care | Payer: Medicaid Other | Attending: Family Medicine | Admitting: Family Medicine

## 2022-04-03 ENCOUNTER — Other Ambulatory Visit: Payer: Self-pay

## 2022-04-03 DIAGNOSIS — R197 Diarrhea, unspecified: Secondary | ICD-10-CM | POA: Diagnosis not present

## 2022-04-03 DIAGNOSIS — K529 Noninfective gastroenteritis and colitis, unspecified: Secondary | ICD-10-CM

## 2022-04-03 DIAGNOSIS — R1084 Generalized abdominal pain: Secondary | ICD-10-CM | POA: Diagnosis not present

## 2022-04-03 DIAGNOSIS — R42 Dizziness and giddiness: Secondary | ICD-10-CM | POA: Insufficient documentation

## 2022-04-03 DIAGNOSIS — R0602 Shortness of breath: Secondary | ICD-10-CM | POA: Insufficient documentation

## 2022-04-03 DIAGNOSIS — R112 Nausea with vomiting, unspecified: Secondary | ICD-10-CM | POA: Diagnosis present

## 2022-04-03 DIAGNOSIS — Z5321 Procedure and treatment not carried out due to patient leaving prior to being seen by health care provider: Secondary | ICD-10-CM | POA: Diagnosis not present

## 2022-04-03 LAB — COMPREHENSIVE METABOLIC PANEL
ALT: 18 U/L (ref 0–44)
AST: 15 U/L (ref 15–41)
Albumin: 4.2 g/dL (ref 3.5–5.0)
Alkaline Phosphatase: 55 U/L (ref 38–126)
Anion gap: 9 (ref 5–15)
BUN: 14 mg/dL (ref 6–20)
CO2: 23 mmol/L (ref 22–32)
Calcium: 9.5 mg/dL (ref 8.9–10.3)
Chloride: 104 mmol/L (ref 98–111)
Creatinine, Ser: 0.86 mg/dL (ref 0.44–1.00)
GFR, Estimated: >60 mL/min (ref >60)
Glucose, Bld: 128 mg/dL — ABNORMAL HIGH (ref 70–99)
Potassium: 4.2 mmol/L (ref 3.5–5.1)
Sodium: 136 mmol/L (ref 135–145)
Total Bilirubin: 1.1 mg/dL (ref 0.3–1.2)
Total Protein: 8.3 g/dL — ABNORMAL HIGH (ref 6.5–8.1)

## 2022-04-03 LAB — URINALYSIS, ROUTINE W REFLEX MICROSCOPIC
Bacteria, UA: NONE SEEN
Bilirubin Urine: NEGATIVE
Glucose, UA: NEGATIVE mg/dL
Ketones, ur: 20 mg/dL — AB
Leukocytes,Ua: NEGATIVE
Nitrite: NEGATIVE
Protein, ur: NEGATIVE mg/dL
Specific Gravity, Urine: 1.027 (ref 1.005–1.030)
pH: 5 (ref 5.0–8.0)

## 2022-04-03 LAB — CBC
HCT: 45 % (ref 36.0–46.0)
Hemoglobin: 14.9 g/dL (ref 12.0–15.0)
MCH: 29.8 pg (ref 26.0–34.0)
MCHC: 33.1 g/dL (ref 30.0–36.0)
MCV: 90 fL (ref 80.0–100.0)
Platelets: 239 10*3/uL (ref 150–400)
RBC: 5 MIL/uL (ref 3.87–5.11)
RDW: 13.6 % (ref 11.5–15.5)
WBC: 10.2 10*3/uL (ref 4.0–10.5)
nRBC: 0 % (ref 0.0–0.2)

## 2022-04-03 LAB — I-STAT BETA HCG BLOOD, ED (MC, WL, AP ONLY): I-stat hCG, quantitative: <5 m[IU]/mL (ref <5)

## 2022-04-03 LAB — LIPASE, BLOOD: Lipase: 19 U/L (ref 11–51)

## 2022-04-03 MED ORDER — ONDANSETRON 4 MG PO TBDP: 4.0000 mg | ORAL_TABLET | Freq: Three times a day (TID) | ORAL | 0 refills | Status: DC | PRN

## 2022-04-03 MED ORDER — ONDANSETRON 4 MG PO TBDP: 4.0000 mg | ORAL_TABLET | Freq: Once | ORAL | Status: DC

## 2022-04-03 MED ORDER — KETOROLAC TROMETHAMINE 30 MG/ML IJ SOLN
30.0000 mg | Freq: Once | INTRAMUSCULAR | Status: AC
  Administered 2022-04-03: 30 mg via INTRAMUSCULAR

## 2022-04-03 MED ORDER — ONDANSETRON 4 MG PO TBDP
4.0000 mg | ORAL_TABLET | Freq: Once | ORAL | Status: AC
  Administered 2022-04-03: 4 mg via ORAL

## 2022-04-03 NOTE — ED Triage Notes (Addendum)
Patient c/o SOB, dizziness, V/D and abdominal cramping since 0300 today.  BP 188/125 in triage. Patient states she has not taken hr BP meds in 4 months.

## 2022-04-03 NOTE — Discharge Instructions (Addendum)
You have been given a shot of Toradol 30 mg today.  Ondansetron dissolved in the mouth every 8 hours as needed for nausea or vomiting. You were given one dose here tonight.  Clear liquids and bland things to eat.  Go to the ER if you continue to throw up despite using the ondansetron medicine.

## 2022-04-03 NOTE — ED Notes (Signed)
Pt left. Sts shes not waiting any longer

## 2022-04-03 NOTE — ED Provider Notes (Signed)
[facility]    CSN: [id number] Arrival date & time: 04/03/22  1904      History   Chief Complaint Chief Complaint  Patient presents with   Nausea   Vomiting   Diarrhea   Shortness of Breath   Chest Pain   Flank Pain    HPI Kayla Bautista is a 31 y.o. female.    Diarrhea Shortness of Breath Associated symptoms: chest pain   Chest Pain Associated symptoms: shortness of breath   Flank Pain Associated symptoms include chest pain and shortness of breath.   Here for nausea and vomiting that was first noted this morning.  Also she had diarrhea at that time, several episodes.  She is thrown up about 10 times today.  Remains nauseated.  She also has pleuritic chest pain and pain in her upper back.  She cannot tell that she has had fever or chills.  Last menstrual cycle was June 18.  Urea.  She states her urine does smell.   Past Medical History:  Diagnosis Date   Hypertension    Infection    UTI   Obesity    Stye     Patient Active Problem List   Diagnosis Date Noted   BMI 45.0-49.9, adult (HCC) 03/03/2014   Hypertension 03/03/2014   Yeast infection involving the vagina and surrounding area 08/10/2013    Past Surgical History:  Procedure Laterality Date   NO PAST SURGERIES      OB History     Gravida  1   Para  1   Term  1   Preterm      AB      Living  1      SAB      IAB      Ectopic      Multiple      Live Births  1            Home Medications    Prior to Admission medications   Medication Sig Start Date End Date Taking? Authorizing Provider  ondansetron (ZOFRAN-ODT) 4 MG disintegrating tablet Take 1 tablet (4 mg total) by mouth every 8 (eight) hours as needed for nausea or vomiting. 04/03/22  Yes Zenia Resides, MD    Family History Family History  Problem Relation Age of Onset   Diabetes Mother    Diabetes Maternal Grandmother    Diabetes Maternal Grandfather     Social History Social History    Tobacco Use   Smoking status: Some Days    Packs/day: 0.25    Years: 2.00    Total pack years: 0.50    Types: Cigarettes    Last attempt to quit: 05/04/2013    Years since quitting: 8.9   Smokeless tobacco: Never  Vaping Use   Vaping Use: Never used  Substance Use Topics   Alcohol use: Yes    Comment: occ   Drug use: No     Allergies   Tylenol [acetaminophen]   Review of Systems Review of Systems  Respiratory:  Positive for shortness of breath.   Cardiovascular:  Positive for chest pain.  Gastrointestinal:  Positive for diarrhea.  Genitourinary:  Positive for flank pain.     Physical Exam Triage Vital Signs ED Triage Vitals  Enc Vitals Group     BP 04/03/22 1934 123/88     Pulse Rate 04/03/22 1923 97     Resp 04/03/22 1923 20     Temp 04/03/22 1923 99.2 F (37.3  C)     Temp Source 04/03/22 1923 Oral     SpO2 04/03/22 1934 98 %     Weight --      Height --      Head Circumference --      Peak Flow --      Pain Score 04/03/22 1933 10     Pain Loc --      Pain Edu? --      Excl. in GC? --    No data found.  Updated Vital Signs BP 123/88 (BP Location: Left Arm)   Pulse (!) 105   Temp 99.2 F (37.3 C) (Oral)   Resp (!) 22   LMP 03/16/2022 (Exact Date)   SpO2 98%   Visual Acuity Right Eye Distance:   Left Eye Distance:   Bilateral Distance:    Right Eye Near:   Left Eye Near:    Bilateral Near:     Physical Exam Vitals reviewed.  Constitutional:      General: She is not in acute distress.    Appearance: She is not toxic-appearing.  HENT:     Nose: Nose normal.     Mouth/Throat:     Mouth: Mucous membranes are moist.     Pharynx: No oropharyngeal exudate or posterior oropharyngeal erythema.  Eyes:     Extraocular Movements: Extraocular movements intact.     Conjunctiva/sclera: Conjunctivae normal.     Pupils: Pupils are equal, round, and reactive to light.  Cardiovascular:     Rate and Rhythm: Normal rate and regular rhythm.     Heart  sounds: No murmur heard. Pulmonary:     Effort: Pulmonary effort is normal. No respiratory distress.     Breath sounds: No wheezing, rhonchi or rales.  Chest:     Chest wall: No tenderness.  Abdominal:     Palpations: Abdomen is soft.     Tenderness: There is no abdominal tenderness.  Musculoskeletal:        General: Tenderness (Her thoracic musculature bilaterally is tender) present.     Cervical back: Neck supple.  Lymphadenopathy:     Cervical: No cervical adenopathy.  Skin:    Capillary Refill: Capillary refill takes less than 2 seconds.     Coloration: Skin is not jaundiced or pale.     Findings: No rash.  Neurological:     General: No focal deficit present.     Mental Status: She is alert and oriented to person, place, and time.  Psychiatric:        Behavior: Behavior normal.      UC Treatments / Results  Labs (all labs ordered are listed, but only abnormal results are displayed) Labs Reviewed - No data to display  EKG   Radiology No results found.  Procedures Procedures (including critical care time)  Medications Ordered in UC Medications  ketorolac (TORADOL) 30 MG/ML injection 30 mg (has no administration in time range)  ondansetron (ZOFRAN-ODT) disintegrating tablet 4 mg (has no administration in time range)    Initial Impression / Assessment and Plan / UC Course  I have reviewed the triage vital signs and the nursing notes.  Pertinent labs & imaging results that were available during my care of the patient were reviewed by me and considered in my medical decision making (see chart for details).     We are going to treat for gastroenteritis.  Zofran and Toradol given here to help relieve her symptoms.  Clear liquids and a bland diet.  If she persists in throwing up despite the medication, she is to return to the ER.  She had gone there and waited about 6 hours before coming here to be seen Final Clinical Impressions(s) / UC Diagnoses   Final  diagnoses:  Gastroenteritis     Discharge Instructions      You have been given a shot of Toradol 30 mg today.  Ondansetron dissolved in the mouth every 8 hours as needed for nausea or vomiting. You were given one dose here tonight.  Clear liquids and bland things to eat.  Go to the ER if you continue to throw up despite using the ondansetron medicine.     ED Prescriptions     Medication Sig Dispense Auth. Provider   ondansetron (ZOFRAN-ODT) 4 MG disintegrating tablet Take 1 tablet (4 mg total) by mouth every 8 (eight) hours as needed for nausea or vomiting. 10 tablet Marlinda Mike Janace Aris, MD      PDMP not reviewed this encounter.   Zenia Resides, MD 04/03/22 1945

## 2022-04-03 NOTE — ED Triage Notes (Signed)
Patient presents to Urgent Care with complaints of nausea, vomiting, diarrhea, back/flank pain, and cp since 0300 today. Patient reports she waited for 6 hours at [facility] long and provided blood work and urine sample but did not make it past triage to see a provider.  Pt is very tearful in triage. When nurse asked what is wrong and why she is crying. Pt st she has "other things going on" nurse asked if she wanted to share and patient st "no"  Pt st she has not ben able to keep anything down today other than half of a gateraid. Only urinated once which was red in color according to patient. Marland Kitchen

## 2022-04-03 NOTE — ED Provider Triage Note (Signed)
Emergency Medicine Provider Triage Evaluation Note  Malena Timpone , a 31 y.o. female  was evaluated in triage.  Pt complains of nausea, vomiting, diarrhea, onset 3am today. No known sick contacts. Chills  Review of Systems  Positive: N/v/d/ chills, abdominal pain (generalized) Negative: Changes in bladder habits  Physical Exam  BP (!) 188/125 (BP Location: Left Arm)   Pulse 84   Temp 99.2 F (37.3 C) (Oral)   Resp 20   Ht 5\' 9"  (1.753 m)   Wt 131.5 kg   LMP 03/16/2022 (Exact Date)   SpO2 100%   BMI 42.83 kg/m  Gen:   Awake, no distress   Resp:  Normal effort  MSK:   Moves extremities without difficulty  Other:    Medical Decision Making  Medically screening exam initiated at 1:46 PM.  Appropriate orders placed.  Niajah Sipos was informed that the remainder of the evaluation will be completed by another provider, this initial triage assessment does not replace that evaluation, and the importance of remaining in the ED until their evaluation is complete.     [name], PA-C 04/03/22 1347

## 2022-06-25 ENCOUNTER — Ambulatory Visit
Admission: EM | Admit: 2022-06-25 | Discharge: 2022-06-25 | Disposition: A | Discharge status: Home / Self Care | Payer: Medicaid Other | Attending: Internal Medicine | Admitting: Internal Medicine

## 2022-06-25 ENCOUNTER — Ambulatory Visit: Payer: Self-pay

## 2022-06-25 DIAGNOSIS — Z113 Encounter for screening for infections with a predominantly sexual mode of transmission: Secondary | ICD-10-CM | POA: Diagnosis present

## 2022-06-25 DIAGNOSIS — R1084 Generalized abdominal pain: Secondary | ICD-10-CM | POA: Diagnosis present

## 2022-06-25 LAB — POCT URINALYSIS DIP (MANUAL ENTRY)
Bilirubin, UA: NEGATIVE
Glucose, UA: NEGATIVE mg/dL
Ketones, POC UA: NEGATIVE mg/dL
Leukocytes, UA: NEGATIVE
Nitrite, UA: NEGATIVE
Protein Ur, POC: NEGATIVE mg/dL
Spec Grav, UA: 1.015 (ref 1.010–1.025)
Urobilinogen, UA: 1 E.U./dL
pH, UA: 7 (ref 5.0–8.0)

## 2022-06-25 LAB — POCT URINE PREGNANCY: Preg Test, Ur: NEGATIVE

## 2022-06-25 NOTE — ED Provider Notes (Addendum)
[facility]    CSN: [id number] Arrival date & time: 06/25/22  1447      History   Chief Complaint Chief Complaint  Patient presents with   sti screening    HPI Kayla Bautista is a 31 y.o. female.   Patient presents for further evaluation of abdominal pain that has been present since waking this morning.  Patient reports that she does not currently have any pain and that it is intermittent.  She reports that it is a mild, cramping pain and is located in the left lower quadrant mainly.  Denies nausea, vomiting, diarrhea.  Having normal bowel movements.  Denies blood in stool.  Denies vaginal discharge, abnormal vaginal bleeding, dysuria, urinary frequency, hematuria, back pain, fever.  Patient is concerned for sexually transmitted disease given that she had similar abdominal pain in the past when she had an STD.  Patient reports unprotected intercourse with new sexual partner but no confirmed exposure to STD.  Last menstrual cycle was 06/12/2022.  Denies any associated fever.  Patient also has significantly elevated blood pressure reading.  Patient reports that she used to take blood pressure medication that she is not sure the name of about a year ago.  She does still has leftover medication but reports that she stopped taking it because she "wanted to".  She has not seen a PCP in about a year.  Denies chest pain, shortness of breath, headache, dizziness, blurred vision, nausea, vomiting.     Past Medical History:  Diagnosis Date   Hypertension    Infection    UTI   Obesity    Stye     Patient Active Problem List   Diagnosis Date Noted   BMI 45.0-49.9, adult (HCC) 03/03/2014   Hypertension 03/03/2014   Yeast infection involving the vagina and surrounding area 08/10/2013    Past Surgical History:  Procedure Laterality Date   NO PAST SURGERIES      OB History     Gravida  1   Para  1   Term  1   Preterm      AB      Living  1      SAB       IAB      Ectopic      Multiple      Live Births  1            Home Medications    Prior to Admission medications   Medication Sig Start Date End Date Taking? Authorizing Provider  ondansetron (ZOFRAN-ODT) 4 MG disintegrating tablet Take 1 tablet (4 mg total) by mouth every 8 (eight) hours as needed for nausea or vomiting. 04/03/22   Zenia Resides, MD    Family History Family History  Problem Relation Age of Onset   Diabetes Mother    Diabetes Maternal Grandmother    Diabetes Maternal Grandfather     Social History Social History   Tobacco Use   Smoking status: Some Days    Packs/day: 0.25    Years: 2.00    Total pack years: 0.50    Types: Cigarettes    Last attempt to quit: 05/04/2013    Years since quitting: 9.1   Smokeless tobacco: Never  Vaping Use   Vaping Use: Never used  Substance Use Topics   Alcohol use: Yes    Comment: occ   Drug use: No     Allergies   Tylenol [acetaminophen]   Review of Systems Review of  Systems Per HPI  Physical Exam Triage Vital Signs ED Triage Vitals  Enc Vitals Group     BP 06/25/22 1512 (!) 185/112     Pulse Rate 06/25/22 1511 92     Resp 06/25/22 1511 16     Temp 06/25/22 1511 98 F (36.7 C)     Temp Source 06/25/22 1511 Oral     SpO2 06/25/22 1511 98 %     Weight --      Height --      Head Circumference --      Peak Flow --      Pain Score 06/25/22 1512 0     Pain Loc --      Pain Edu? --      Excl. in GC? --    No data found.  Updated Vital Signs BP (!) 172/106   Pulse 92   Temp 98 F (36.7 C) (Oral)   Resp 16   SpO2 98%   Visual Acuity Right Eye Distance:   Left Eye Distance:   Bilateral Distance:    Right Eye Near:   Left Eye Near:    Bilateral Near:     Physical Exam Constitutional:      General: She is not in acute distress.    Appearance: Normal appearance. She is not toxic-appearing or diaphoretic.  HENT:     Head: Normocephalic and atraumatic.  Eyes:     Extraocular  Movements: Extraocular movements intact.     Conjunctiva/sclera: Conjunctivae normal.  Cardiovascular:     Rate and Rhythm: Normal rate and regular rhythm.     Pulses: Normal pulses.     Heart sounds: Normal heart sounds.  Pulmonary:     Effort: Pulmonary effort is normal. No respiratory distress.     Breath sounds: Normal breath sounds.  Abdominal:     General: Bowel sounds are normal. There is no distension.     Palpations: Abdomen is soft.     Tenderness: There is no abdominal tenderness. There is no guarding or rebound. Negative signs include Murphy's sign, Rovsing's sign, McBurney's sign, psoas sign and obturator sign.  Neurological:     General: No focal deficit present.     Mental Status: She is alert and oriented to person, place, and time. Mental status is at baseline.     Cranial Nerves: Cranial nerves 2-12 are intact.     Sensory: Sensation is intact.     Motor: Motor function is intact.     Coordination: Coordination is intact.     Gait: Gait is intact.  Psychiatric:        Mood and Affect: Mood normal.        Behavior: Behavior normal.        Thought Content: Thought content normal.        Judgment: Judgment normal.      UC Treatments / Results  Labs (all labs ordered are listed, but only abnormal results are displayed) Labs Reviewed  POCT URINALYSIS DIP (MANUAL ENTRY) - Abnormal; Notable for the following components:      Result Value   Blood, UA moderate (*)    All other components within normal limits  POCT URINE PREGNANCY  CERVICOVAGINAL ANCILLARY ONLY    EKG   Radiology No results found.  Procedures Procedures (including critical care time)  Medications Ordered in UC Medications - No data to display  Initial Impression / Assessment and Plan / UC Course  I have reviewed the triage vital signs  and the nursing notes.  Pertinent labs & imaging results that were available during my care of the patient were reviewed by me and considered in my  medical decision making (see chart for details).     Patient's physical exam is benign and is not in any severe abdominal pain.  She is also not currently in any pain at this time.  Therefore, there is no concern for acute abdomen or need for emergent evaluation or imaging of the abdomen.  Urinalysis was unremarkable.  It did show a small amount of red blood cells which could indicate kidney stone but I do have a low suspicion for this given patient's physical exam and symptoms.  Patient is mainly concerned for STD as that she had had similar pain in the past for STD exposure.  Therefore, cervicovaginal swab is pending.  Will await results for further treatment given no confirmed exposure.  Other differentials include gastroenteritis, ovarian cyst, diverticulitis.  Although, I do have low concern for all of these differentials.  Therefore, will await vaginal swab for any further recommendations.  Patient was advised to go to the emergency department if symptoms persist or worsen.    Patient also has elevated blood pressure reading with retake being slightly improved.  Patient denies any associated symptoms so there is no concern for hypertensive urgency as there are no signs of endorgan damage and as neuro exam is also normal.  Patient states that she has her blood pressure medication at home and patient was advised to restart this blood pressure medication as soon as possible.  Advised to monitor blood pressure at home.  Patient advised to follow-up if blood pressure remains elevated and was given strict ER precautions.  Patient verbalized understanding and was agreeable with plan. Final Clinical Impressions(s) / UC Diagnoses   Final diagnoses:  Generalized abdominal pain  Screening examination for venereal disease     Discharge Instructions      Your urine test did not show any obvious abnormalities.  Your vaginal swab is pending.  We will call if it is abnormal and treat as appropriate.  We ask  that you refrain from sexual activity until test results and treatment are complete.  Please go to the emergency department if symptoms persist or worsen.     ED Prescriptions   None    PDMP not reviewed this encounter.   Teodora Medici, [place] 06/25/22 [place], [place], [place] 06/25/22 [phone]

## 2022-06-25 NOTE — ED Triage Notes (Signed)
Pt c/o abd pain, onset this morning. Reports sexual encounter last week and concerned for sti. Denies pain in triage.

## 2022-06-25 NOTE — Discharge Instructions (Signed)
Your urine test did not show any obvious abnormalities.  Your vaginal swab is pending.  We will call if it is abnormal and treat as appropriate.  We ask that you refrain from sexual activity until test results and treatment are complete.  Please go to the emergency department if symptoms persist or worsen.

## 2022-06-26 ENCOUNTER — Telehealth (HOSPITAL_COMMUNITY): Payer: Self-pay | Admitting: Emergency Medicine

## 2022-06-26 LAB — CERVICOVAGINAL ANCILLARY ONLY
Bacterial Vaginitis (gardnerella): NEGATIVE
Candida Glabrata: NEGATIVE
Candida Vaginitis: POSITIVE — AB
Chlamydia: NEGATIVE
Comment: NEGATIVE
Comment: NEGATIVE
Comment: NEGATIVE
Comment: NEGATIVE
Comment: NEGATIVE
Comment: NORMAL
Neisseria Gonorrhea: NEGATIVE
Trichomonas: NEGATIVE

## 2022-06-26 MED ORDER — FLUCONAZOLE 150 MG PO TABS: 150.0000 mg | ORAL_TABLET | Freq: Once | ORAL | 0 refills | Status: AC

## 2023-02-20 ENCOUNTER — Ambulatory Visit: Admission: EM | Admit: 2023-02-20 | Payer: Self-pay | Source: Home / Self Care

## 2023-02-20 ENCOUNTER — Ambulatory Visit
Admission: EM | Admit: 2023-02-20 | Discharge: 2023-02-20 | Disposition: A | Discharge status: Home / Self Care | Payer: Medicaid Other

## 2023-02-20 DIAGNOSIS — R0789 Other chest pain: Secondary | ICD-10-CM | POA: Diagnosis not present

## 2023-02-20 DIAGNOSIS — I10 Essential (primary) hypertension: Secondary | ICD-10-CM

## 2023-02-20 DIAGNOSIS — R42 Dizziness and giddiness: Secondary | ICD-10-CM

## 2023-02-20 DIAGNOSIS — Z114 Encounter for screening for human immunodeficiency virus [HIV]: Secondary | ICD-10-CM

## 2023-02-20 DIAGNOSIS — Z113 Encounter for screening for infections with a predominantly sexual mode of transmission: Secondary | ICD-10-CM

## 2023-02-20 NOTE — ED Triage Notes (Signed)
Pt states she thinks her blood pressure was high and doesn't take her medication everyday and her provider told her to start taking it everyday and started this week taking it everyday before work, did not take it today. Pt states yesterday she was sitting outside and started to feel hot and dizzy then she got up and everything went black and felt like she was going to pass out, then felt fine after a few minutes.

## 2023-02-20 NOTE — Discharge Instructions (Signed)
As discussed your blood pressure medication at night that should reduce episodes of dizziness.  Your blood work that was collected today will result within 3 to 4 days.  We will notify you via MyChart only if results are abnormal.  All results will update to MyChart once they have resulted at the lab Have placed a referral for you to be seen by cardiologist as there is some mild changes on your EKG compared to the last one you had last year.  Given your history of high blood pressure I would like for you to have a second opinion by cardiologist to ensure that there is no underlying concerns with your heart.

## 2023-02-20 NOTE — ED Provider Notes (Signed)
[facility]    CSN: [id number] Arrival date & time: 02/20/23  8101      History   Chief Complaint Chief Complaint  Patient presents with  . Hypertension    HPI Kayla Bautista is a 32 y.o. female.   HPI Patient with a history of hypertension presents today with concerns of dizziness.  Had been off of her blood pressure medications and recently resumed taking amlodipine 10 mg daily and has had intermittent episodes of feeling as she was about to pass out and dizziness.  On arrival today patient's blood pressure is 138/101.  She has not taken her blood pressure medication today.  She reports that she takes her blood pressure medication intermittently mostly only on the weekends as the medication has always caused her some dizziness.  She reports that her dose was increased from amlodipine 5 mg amlodipine 10 mg by her PCP to try and obtain better control of blood pressure.  She reports on yesterday she had a severe dizziness episode where she felt like she was going to blackout and had some chest heaviness.  She denies any family history of heart disease or heart attack.  She denies any headache or blurring of vision or active chest pain.  Past Medical History:  Diagnosis Date  . Hypertension   . Infection    UTI  . Obesity   . Stye     Patient Active Problem List   Diagnosis Date Noted  . BMI 45.0-49.9, adult (HCC) 03/03/2014  . Hypertension 03/03/2014  . Yeast infection involving the vagina and surrounding area 08/10/2013    Past Surgical History:  Procedure Laterality Date  . NO PAST SURGERIES      OB History     Gravida  1   Para  1   Term  1   Preterm      AB      Living  1      SAB      IAB      Ectopic      Multiple      Live Births  1            Home Medications    Prior to Admission medications   Medication Sig Start Date End Date Taking? Authorizing Provider  amLODipine (NORVASC) 10 MG tablet Take 10 mg by mouth daily.  02/11/23  Yes [provider]  valsartan-hydrochlorothiazide (DIOVAN-HCT) 160-25 MG tablet Take 1 tablet by mouth daily. 02/11/23  Yes [provider]  ondansetron (ZOFRAN-ODT) 4 MG disintegrating tablet Take 1 tablet (4 mg total) by mouth every 8 (eight) hours as needed for nausea or vomiting. 04/03/22   Zenia Resides, MD    Family History Family History  Problem Relation Age of Onset  . Diabetes Mother   . Diabetes Maternal Grandmother   . Diabetes Maternal Grandfather     Social History Social History   Tobacco Use  . Smoking status: Some Days    Packs/day: 0.25    Years: 2.00    Additional pack years: 0.00    Total pack years: 0.50    Types: Cigarettes    Last attempt to quit: 05/04/2013    Years since quitting: 9.8  . Smokeless tobacco: Never  Vaping Use  . Vaping Use: Never used  Substance Use Topics  . Alcohol use: Yes    Comment: occ  . Drug use: No     Allergies   Tylenol [acetaminophen]   Review of  Systems Review of Systems Pertinent negatives listed in HPI  Physical Exam Triage Vital Signs ED Triage Vitals  Enc Vitals Group     BP 02/20/23 0915 (!) 138/101     Pulse Rate 02/20/23 0916 92     Resp 02/20/23 0915 16     Temp 02/20/23 0915 97.9 F (36.6 C)     Temp Source 02/20/23 0915 Oral     SpO2 02/20/23 0916 96 %     Weight --      Height --      Head Circumference --      Peak Flow --      Pain Score 02/20/23 0921 0     Pain Loc --      Pain Edu? --      Excl. in GC? --    Orthostatic VS for the past 24 hrs:  BP- Lying Pulse- Lying BP- Sitting Pulse- Sitting Pulse- Standing at 0 minutes  02/20/23 0924 136/82 82 134/84 88 101    Updated Vital Signs BP (!) 138/101 (BP Location: Left Arm)   Pulse 92   Temp 98.2 F (36.8 C) (Oral)   Resp 16   LMP 02/06/2023 (Approximate)   SpO2 96%   Visual Acuity Right Eye Distance:   Left Eye Distance:   Bilateral Distance:    Right Eye Near:   Left Eye Near:    Bilateral  Near:     Physical Exam   UC Treatments / Results  Labs (all labs ordered are listed, but only abnormal results are displayed) Labs Reviewed - No data to display  EKG   Radiology No results found.  Procedures Procedures (including critical care time)  Medications Ordered in UC Medications - No data to display  Initial Impression / Assessment and Plan / UC Course  I have reviewed the triage vital signs and the nursing notes.  Pertinent labs & imaging results that were available during my care of the patient were reviewed by me and considered in my medical decision making (see chart for details).     *** Final Clinical Impressions(s) / UC Diagnoses   Final diagnoses:  None   Discharge Instructions   None    ED Prescriptions   None    PDMP not reviewed this encounter.

## 2023-02-21 LAB — HIV ANTIBODY (ROUTINE TESTING W REFLEX): HIV Screen 4th Generation wRfx: NONREACTIVE

## 2023-02-21 LAB — RPR: RPR Ser Ql: NONREACTIVE

## 2023-03-30 ENCOUNTER — Ambulatory Visit: Payer: Medicaid Other | Attending: Internal Medicine | Admitting: Internal Medicine

## 2023-03-30 VITALS — BP 142/98 | HR 83 | Ht 69.0 in | Wt 320.0 lb

## 2023-03-30 DIAGNOSIS — R0789 Other chest pain: Secondary | ICD-10-CM

## 2023-03-30 NOTE — Patient Instructions (Signed)
Medication Instructions:  Your physician recommends that you continue on your current medications as directed. Please refer to the Current Medication list given to you today.  *If you need a refill on your cardiac medications before your next appointment, please call your pharmacy*   Lab Work: None   Testing/Procedures: None   Follow-Up: At [facility], you and your health needs are our priority.  As part of our continuing mission to provide you with exceptional heart care, we have created designated Provider Care Teams.  These Care Teams include your primary Cardiologist (physician) and Advanced Practice Providers (APPs -  Physician Assistants and Nurse Practitioners) who all work together to provide you with the care you need, when you need it.   Your next appointment:    As needed  Provider:   Mary Branch, MD   

## 2023-03-30 NOTE — Progress Notes (Addendum)
Cardiology Office Note:    Date:  03/30/2023   ID:  Kayla Bautista, DOB 05/28/1991, MRN [id number]  PCP:  Aviva Kluver   [facility] Providers Cardiologist:  None     Referring MD: Bing Neighbors, NP   No chief complaint on file. Dizziness  History of Present Illness:    Kayla Bautista is a 32 y.o. female with a hx of HTN, morbid obesity, referral from urgent care 02/20/2023 for dizziness. She notes the day prior to urgent care she had a presyncopal event.. She notes she felt hot, got tunnel vision. She got up. She then had a presyncopal event. She said her chest felt tight as well. Since then she has had no issues. Recently, she stopped taking her medications, she was concerned about this was contributing to her symptoms.   Past Medical History:  Diagnosis Date   Hypertension    Infection    UTI   Obesity    Stye     Past Surgical History:  Procedure Laterality Date   NO PAST SURGERIES      Current Medications: Current Outpatient Medications on File Prior to Visit  Medication Sig Dispense Refill   amLODipine (NORVASC) 10 MG tablet Take 10 mg by mouth daily.     valsartan-hydrochlorothiazide (DIOVAN-HCT) 160-25 MG tablet Take 1 tablet by mouth daily.     ondansetron (ZOFRAN-ODT) 4 MG disintegrating tablet Take 1 tablet (4 mg total) by mouth every 8 (eight) hours as needed for nausea or vomiting. (Patient not taking: Reported on 03/30/2023) 10 tablet 0   No current facility-administered medications on file prior to visit.    Allergies:   Tylenol [acetaminophen]   Social History   Socioeconomic History   Marital status: Single    Spouse name: Not on file   Number of children: Not on file   Years of education: Not on file   Highest education level: Not on file  Occupational History   Not on file  Tobacco Use   Smoking status: Some Days    Packs/day: 0.25    Years: 2.00    Additional pack years: 0.00    Total pack years: 0.50    Types: Cigarettes     Last attempt to quit: 05/04/2013    Years since quitting: 9.9   Smokeless tobacco: Never  Vaping Use   Vaping Use: Never used  Substance and Sexual Activity   Alcohol use: Yes    Comment: occ   Drug use: No   Sexual activity: Yes    Birth control/protection: None  Other Topics Concern   Not on file  Social History Narrative   Not on file   Social Determinants of Health   Financial Resource Strain: Not on file  Food Insecurity: Not on file  Transportation Needs: Not on file  Physical Activity: Not on file  Stress: Not on file  Social Connections: Not on file     Family History: The patient's family history includes Diabetes in her maternal grandfather, maternal grandmother, and mother. No premature CAD, no cardiac dx  ROS:   Please see the history of present illness.     All other systems reviewed and are negative.  EKGs/Labs/Other Studies Reviewed:    The following studies were reviewed today:  EKG Interpretation Date/Time:  Monday March 30 2023 08:59:32 EDT Ventricular Rate:  84 PR Interval:  144 QRS Duration:  80 QT Interval:  396 QTC Calculation: 467 R Axis:   40  Text Interpretation: Normal  sinus rhythm Nonspecific T wave abnormality When compared with ECG of 20-Feb-2023 10:53, No significant change was found Confirmed by Carolan Clines [phone]) on 03/30/2023 9:19:54 AM        Recent Labs: 04/03/2022: ALT 18; BUN 14; Creatinine, Ser 0.86; Hemoglobin 14.9; Platelets 239; Potassium 4.2; Sodium 136  Recent Lipid Panel No results found for: "CHOL", "TRIG", "HDL", "CHOLHDL", "VLDL", "LDLCALC", "LDLDIRECT"   Risk Assessment/Calculations:    Physical Exam:    VS: Vitals:   03/30/23 0852  BP: (!) 142/98  Pulse: 83  SpO2: 99%    Wt Readings from Last 3 Encounters:  04/03/22 290 lb (131.5 kg)  06/04/21 (!) 310 lb (140.6 kg)  02/14/20 (!) 300 lb 4.8 oz (136.2 kg)     GEN: Obese, Well nourished, well developed in no acute distress HEENT: Normal NECK: No JVD;  No carotid bruits CARDIAC: RRR, no murmurs, rubs, gallops RESPIRATORY:  Clear to auscultation without rales, wheezing or rhonchi  ABDOMEN: Soft, non-tender, non-distended MUSCULOSKELETAL:  No edema; No deformity  SKIN: Warm and dry NEUROLOGIC:  Alert and oriented x 3 PSYCHIATRIC:  Normal affect   ASSESSMENT:    Vasovagal Presyncope/Dizziness/CP -resolved -recommended hydration with electrolytes - low risk for CAD, no signs of HOCM  HTN -well controlled -denies apnea  PLAN:    In order of problems listed above:  No further cardiac w/u     Medication Adjustments/Labs and Tests Ordered: Current medicines are reviewed at length with the patient today.  Concerns regarding medicines are outlined above.  Orders Placed This Encounter  Procedures   EKG 12-Lead   No orders of the defined types were placed in this encounter.   There are no Patient Instructions on file for this visit.   Signed, Maisie Fus, MD  03/30/2023 8:55 AM    [facility]

## 2023-08-10 ENCOUNTER — Other Ambulatory Visit (HOSPITAL_COMMUNITY)
Admission: RE | Admit: 2023-08-10 | Discharge: 2023-08-10 | Disposition: A | Discharge status: Home / Self Care | Payer: Medicaid Other | Source: Ambulatory Visit | Attending: Family Medicine | Admitting: Family Medicine

## 2023-08-10 ENCOUNTER — Encounter: Payer: Self-pay | Admitting: Obstetrics and Gynecology

## 2023-08-10 ENCOUNTER — Ambulatory Visit: Payer: Medicaid Other | Admitting: Obstetrics and Gynecology

## 2023-08-10 VITALS — BP 131/82 | HR 75 | Ht 69.0 in | Wt 297.5 lb

## 2023-08-10 DIAGNOSIS — Z6841 Body Mass Index (BMI) 40.0 and over, adult: Secondary | ICD-10-CM

## 2023-08-10 DIAGNOSIS — Z01419 Encounter for gynecological examination (general) (routine) without abnormal findings: Secondary | ICD-10-CM | POA: Insufficient documentation

## 2023-08-10 DIAGNOSIS — Z113 Encounter for screening for infections with a predominantly sexual mode of transmission: Secondary | ICD-10-CM | POA: Insufficient documentation

## 2023-08-10 NOTE — Progress Notes (Signed)
GYNECOLOGY ANNUAL PREVENTATIVE CARE ENCOUNTER NOTE  History:     Kayla Bautista is a 32 y.o. G40P1001 female here for a routine annual gynecologic exam.  Current complaints: concerned regarding vaginal odor.   Denies abnormal vaginal bleeding, discharge, pelvic pain, problems with intercourse or other gynecologic concerns.    Gynecologic History Patient's last menstrual period was 08/03/2023. Contraception: none Last Pap: 02/14/20. Results were: normal with negative HPV Last mammogram: n/a  Obstetric History OB History  Gravida Para Term Preterm AB Living  1 1 1     1   SAB IAB Ectopic Multiple Live Births          1    # Outcome Date GA Lbr Len/2nd Weight Sex Type Anes PTL Lv  1 Term 12/15/13 [redacted]w[redacted]d 24:53 / 00:17 6 lb 0.8 oz (2.744 kg) F Vag-Spont EPI  LIV    Past Medical History:  Diagnosis Date   Hypertension    Infection    UTI   Obesity    Stye     Past Surgical History:  Procedure Laterality Date   NO PAST SURGERIES      Current Outpatient Medications on File Prior to Visit  Medication Sig Dispense Refill   amLODipine (NORVASC) 10 MG tablet Take 10 mg by mouth daily.     valsartan-hydrochlorothiazide (DIOVAN-HCT) 160-25 MG tablet Take 1 tablet by mouth daily.     No current facility-administered medications on file prior to visit.    Allergies  Allergen Reactions   Tylenol [Acetaminophen] Itching    Throat itches    Social History:  reports that she has been smoking cigarettes. She started smoking about 12 years ago. She has a 0.5 pack-year smoking history. She has never used smokeless tobacco. She reports current alcohol use. She reports that she does not use drugs.  Family History  Problem Relation Age of Onset   Diabetes Mother    Diabetes Maternal Grandmother    Diabetes Maternal Grandfather     The following portions of the patient's history were reviewed and updated as appropriate: allergies, current medications, past family history, past  medical history, past social history, past surgical history and problem list.  Review of Systems Pertinent items noted in HPI and remainder of comprehensive ROS otherwise negative.  Physical Exam:  BP 131/82   Pulse 75   Ht 5\' 9"  (1.753 m)   Wt 297 lb 8 oz (134.9 kg)   LMP 08/03/2023   BMI 43.93 kg/m  CONSTITUTIONAL: Well-developed,morbidly obeses, well-nourished female in no acute distress.  HENT:  Normocephalic, atraumatic, External right and left ear normal. Oropharynx is clear and moist EYES: Conjunctivae and EOM are normal.  NECK: Normal range of motion, supple, no masses.  Normal thyroid.  SKIN: Skin is warm and dry. No rash noted. Not diaphoretic. No erythema. No pallor. MUSCULOSKELETAL: Normal range of motion. No tenderness.  No cyanosis, clubbing, or edema.  2+ distal pulses. NEUROLOGIC: Alert and oriented to person, place, and time. Normal reflexes, muscle tone coordination.  PSYCHIATRIC: Normal mood and affect. Normal behavior. Normal judgment and thought content. CARDIOVASCULAR: Normal heart rate noted, regular rhythm RESPIRATORY: Clear to auscultation bilaterally. Effort and breath sounds normal, no problems with respiration noted. BREASTS: deferred ABDOMEN: Soft, no distention noted.  No tenderness, rebound or guarding.  PELVIC: Normal appearing external genitalia and urethral meatus; normal appearing vaginal mucosa and cervix.  No abnormal discharge noted.  Pap smear obtained.  Vaginal swab obtained.  Physiologic discharge, no odor.  Normal  uterine size, no other palpable masses, no uterine or adnexal tenderness.  Performed in the presence of a chaperone.   Assessment and Plan:    1. Women's annual routine gynecological examination Normal annual exam  - Cytology - PAP( [facility])  2. Routine screening for STI (sexually transmitted infection) Per pt request - Cervicovaginal ancillary only( [facility]) - RPR+HBsAg+HCVAb+...  3. BMI 45.0-49.9, adult (HCC) Pt  would benefit from increased diet and exercise. F/u in 1 year or PRN  Will follow up results of pap smear and manage accordingly. Routine preventative health maintenance measures emphasized. Please refer to After Visit Summary for other counseling recommendations.      Mariel Aloe, MD, FACOG Obstetrician & Gynecologist, [facility] for [facility], [facility]

## 2023-08-11 LAB — CERVICOVAGINAL ANCILLARY ONLY
Bacterial Vaginitis (gardnerella): POSITIVE — AB
Candida Glabrata: NEGATIVE
Candida Vaginitis: NEGATIVE
Chlamydia: NEGATIVE
Comment: NEGATIVE
Comment: NEGATIVE
Comment: NEGATIVE
Comment: NEGATIVE
Comment: NEGATIVE
Comment: NORMAL
Neisseria Gonorrhea: NEGATIVE
Trichomonas: POSITIVE — AB

## 2023-08-11 LAB — RPR+HBSAG+HCVAB+...
HIV Screen 4th Generation wRfx: NONREACTIVE
Hep C Virus Ab: NONREACTIVE
Hepatitis B Surface Ag: NEGATIVE
RPR Ser Ql: NONREACTIVE

## 2023-08-12 ENCOUNTER — Telehealth: Payer: Self-pay | Admitting: Lactation Services

## 2023-08-12 MED ORDER — METRONIDAZOLE 500 MG PO TABS: 500.0000 mg | ORAL_TABLET | Freq: Two times a day (BID) | ORAL | 0 refills | Status: DC

## 2023-08-12 NOTE — Telephone Encounter (Signed)
-----   Message from Warden Fillers sent at 08/11/2023 11:03 PM EST ----- Trichomonas and BV noted on swab, will offer treatment, Partner will need to seek care as well.  TOC in 6-8 weeks

## 2023-08-12 NOTE — Telephone Encounter (Signed)
Called and spoke with patient. She was informed of + Trichimonas and BV.  Patient informed Ivery Quale is an STI and BV is not. Reviewed it is treated with the same medication.   Reviewed taking Flagyl with food and no alcohol while taking.   Reviewed partner needs to be notified and needs to seek treatment from PCP or [facility]. Reviewed no sexual intercourse for 2 weeks after both partners are treated.   Reviewed need for TOC in 6-8 weeks. Schedule at patients convenience.   Patient voiced understanding to all the above. She has no questions or concerns at this time.

## 2023-08-13 LAB — CYTOLOGY - PAP
Comment: NEGATIVE
Comment: NEGATIVE
Comment: NEGATIVE
HPV 16: NEGATIVE
HPV 18 / 45: NEGATIVE
High risk HPV: POSITIVE — AB

## 2023-08-17 ENCOUNTER — Telehealth: Payer: Self-pay

## 2023-08-17 NOTE — Telephone Encounter (Addendum)
-----   Message from Warden Fillers sent at 08/17/2023 12:01 PM EST ----- CIN 1 noted, schedule colposcopy  Left message for pt to return call for results.    Leonette Nutting

## 2023-08-18 NOTE — Telephone Encounter (Signed)
Patient called office to return call--verified with full name and DOB. Reviewed results from below with patient and discussed the process of a colposcopy. Patient agreed for procedure and would like ASAP.   Sent message to front desk staff to schedule colpo. Patient verified understanding and had no further questions or concerns.   Eleesha Purkey RN on 08/18/23 at 1500

## 2023-09-18 ENCOUNTER — Other Ambulatory Visit: Payer: Self-pay

## 2023-09-18 ENCOUNTER — Encounter (HOSPITAL_COMMUNITY): Payer: Self-pay | Admitting: *Deleted

## 2023-09-18 ENCOUNTER — Emergency Department (HOSPITAL_COMMUNITY)
Admission: EM | Admit: 2023-09-18 | Discharge: 2023-09-18 | Discharge status: Against Medical Advice | Payer: Medicaid Other | Attending: Emergency Medicine | Admitting: Emergency Medicine

## 2023-09-18 DIAGNOSIS — R200 Anesthesia of skin: Secondary | ICD-10-CM | POA: Diagnosis not present

## 2023-09-18 DIAGNOSIS — Z5321 Procedure and treatment not carried out due to patient leaving prior to being seen by health care provider: Secondary | ICD-10-CM | POA: Insufficient documentation

## 2023-09-18 DIAGNOSIS — R202 Paresthesia of skin: Secondary | ICD-10-CM | POA: Diagnosis not present

## 2023-09-18 DIAGNOSIS — M545 Low back pain, unspecified: Secondary | ICD-10-CM | POA: Insufficient documentation

## 2023-09-18 NOTE — ED Triage Notes (Signed)
Patient c/o lower back pain onset yest at work , states she heard something pop in her back. Patient is ambulatory and drove herself to ED c/o numbness and tingling in her legs. Gait is steady.

## 2023-09-18 NOTE — ED Notes (Signed)
Pt left the lobby at 0511.

## 2023-09-28 ENCOUNTER — Other Ambulatory Visit (HOSPITAL_COMMUNITY)
Admission: RE | Admit: 2023-09-28 | Discharge: 2023-09-28 | Disposition: A | Discharge status: Home / Self Care | Payer: Medicaid Other | Source: Ambulatory Visit | Attending: Family Medicine | Admitting: Family Medicine

## 2023-09-28 ENCOUNTER — Other Ambulatory Visit: Payer: Self-pay

## 2023-09-28 ENCOUNTER — Ambulatory Visit: Payer: Medicaid Other | Admitting: Family Medicine

## 2023-09-28 ENCOUNTER — Encounter: Payer: Self-pay | Admitting: Family Medicine

## 2023-09-28 VITALS — BP 141/88 | HR 82 | Wt 289.5 lb

## 2023-09-28 DIAGNOSIS — R87612 Low grade squamous intraepithelial lesion on cytologic smear of cervix (LGSIL): Secondary | ICD-10-CM | POA: Insufficient documentation

## 2023-09-28 DIAGNOSIS — Z3202 Encounter for pregnancy test, result negative: Secondary | ICD-10-CM

## 2023-09-28 DIAGNOSIS — Z23 Encounter for immunization: Secondary | ICD-10-CM | POA: Diagnosis not present

## 2023-09-28 LAB — POCT PREGNANCY, URINE: Preg Test, Ur: NEGATIVE

## 2023-09-28 NOTE — Progress Notes (Signed)
    GYNECOLOGY OFFICE COLPOSCOPY PROCEDURE NOTE  32 y.o. G1P1001 here for colposcopy for low-grade squamous intraepithelial neoplasia (LGSIL - encompassing HPV,mild dysplasia,CIN I) pap smear on 08/10/2023. Discussed role for HPV in cervical dysplasia, need for surveillance.  Patient gave informed written consent, time out was performed.  Placed in lithotomy position. Cervix viewed with speculum and colposcope after application of acetic acid.   Colposcopy adequate? Yes  acetowhite lesion(s) noted at SCJ; 4 quadrant biopsies obtained.  ECC specimen obtained. All specimens were labeled and sent to pathology.  Chaperone was present during entire procedure.  Patient was given post procedure instructions.  Will follow up pathology and manage accordingly; patient will be contacted with results and recommendations.  Routine preventative health maintenance measures emphasized.  Given Gardasil today  Reva Bores, MD 09/28/2023 11:41 AM

## 2023-09-29 LAB — SURGICAL PATHOLOGY

## 2023-10-01 ENCOUNTER — Telehealth: Payer: Self-pay | Admitting: *Deleted

## 2023-10-01 NOTE — Telephone Encounter (Addendum)
 Per chart review, pt has seen message in Mychart regarding Pap result

## 2023-10-01 NOTE — Telephone Encounter (Signed)
-----   Message from Reva Bores sent at 09/29/2023  3:50 PM EST ----- Recall pap in 1 year

## 2023-10-05 ENCOUNTER — Other Ambulatory Visit: Payer: Self-pay

## 2023-10-05 ENCOUNTER — Other Ambulatory Visit (HOSPITAL_COMMUNITY)
Admission: RE | Admit: 2023-10-05 | Discharge: 2023-10-05 | Disposition: A | Discharge status: Home / Self Care | Payer: Medicaid Other | Source: Ambulatory Visit | Attending: Family Medicine | Admitting: Family Medicine

## 2023-10-05 ENCOUNTER — Encounter: Payer: Self-pay | Admitting: General Practice

## 2023-10-05 ENCOUNTER — Ambulatory Visit: Payer: Medicaid Other | Admitting: General Practice

## 2023-10-05 VITALS — BP 170/109 | HR 94 | Ht 69.0 in | Wt 291.0 lb

## 2023-10-05 DIAGNOSIS — Z8619 Personal history of other infectious and parasitic diseases: Secondary | ICD-10-CM | POA: Insufficient documentation

## 2023-10-05 NOTE — Progress Notes (Signed)
 Patient presents to office today for test of cure. She tested positive for trichomonas in November. She is also reporting vaginal odor today. Patient was instructed in self swab & specimen collected. Advised results will be back in 24-48 hours and available via mychart. Patient's blood pressure was quite high in office today- reports she worked overnight and hasn't slept but a couple hours this morning and forgot to take her blood pressure medicines. Encouraged patient to come home and take medicines as soon as possible. Patient verbalized understanding.   Elenor DEL RN BSN 10/05/23

## 2023-10-06 LAB — CERVICOVAGINAL ANCILLARY ONLY
Bacterial Vaginitis (gardnerella): POSITIVE — AB
Candida Glabrata: NEGATIVE
Candida Vaginitis: NEGATIVE
Chlamydia: NEGATIVE
Comment: NEGATIVE
Comment: NEGATIVE
Comment: NEGATIVE
Comment: NEGATIVE
Comment: NEGATIVE
Comment: NORMAL
Neisseria Gonorrhea: NEGATIVE
Trichomonas: NEGATIVE

## 2023-10-07 ENCOUNTER — Telehealth: Payer: Self-pay

## 2023-10-07 NOTE — Telephone Encounter (Addendum)
-----   Message from Warden Fillers sent at 10/06/2023  2:13 PM EST ----- BV noted on vaginal swab, offer treatment  Left message that I am calling with results to please give the office a call back.    Kayla Bautista

## 2023-10-08 MED ORDER — METRONIDAZOLE 500 MG PO TABS: 500.0000 mg | ORAL_TABLET | Freq: Two times a day (BID) | ORAL | 0 refills | Status: AC

## 2023-10-08 NOTE — Addendum Note (Signed)
 Addended by: Jill Side on: 10/08/2023 04:33 PM   Modules accepted: Orders

## 2023-10-08 NOTE — Telephone Encounter (Signed)
 Called pt and informed her of test result showing BV. Treatment offered and accepted - Rx e-prescribed to her pharmacy. Pt stated that she had this recently and wants to know what she can do if it comes back again soon. Per chart review, pt had +BV &Trich on 08/10/23. I advised pt that if she has another issue with BV within the next 2 months, she should schedule visit w/provider to discuss treatment options.  She voiced understanding.

## 2023-11-30 ENCOUNTER — Other Ambulatory Visit (HOSPITAL_COMMUNITY)
Admission: RE | Admit: 2023-11-30 | Discharge: 2023-11-30 | Disposition: A | Discharge status: Home / Self Care | Source: Ambulatory Visit | Attending: Family Medicine | Admitting: Family Medicine

## 2023-11-30 ENCOUNTER — Other Ambulatory Visit: Payer: Self-pay

## 2023-11-30 ENCOUNTER — Ambulatory Visit: Payer: Medicaid Other | Admitting: *Deleted

## 2023-11-30 VITALS — BP 171/117 | HR 76 | Ht 69.0 in | Wt 291.6 lb

## 2023-11-30 DIAGNOSIS — Z23 Encounter for immunization: Secondary | ICD-10-CM | POA: Diagnosis not present

## 2023-11-30 DIAGNOSIS — N898 Other specified noninflammatory disorders of vagina: Secondary | ICD-10-CM

## 2023-11-30 NOTE — Progress Notes (Signed)
 Here for 2nd dose HPV vaccine. Injection given without complaint.  BP elevated today, states missed dose for several days. Advised to restart today. States takes at night and will take tonight. BP rechecked, remains elevated. Advised to take medicine today , follow up with PCP and go to ER if any symptoms. Informed will schedule appointment at checkout for 3rd dose in 4 months. Also c/o still has odor even though finished treatment for BV in January. Offered self swab to check. Self swab obtained.  [name]

## 2023-12-01 LAB — CERVICOVAGINAL ANCILLARY ONLY
Bacterial Vaginitis (gardnerella): NEGATIVE
Candida Glabrata: NEGATIVE
Candida Vaginitis: NEGATIVE
Chlamydia: NEGATIVE
Comment: NEGATIVE
Comment: NEGATIVE
Comment: NEGATIVE
Comment: NEGATIVE
Comment: NEGATIVE
Comment: NORMAL
Neisseria Gonorrhea: NEGATIVE
Trichomonas: NEGATIVE

## 2024-03-31 ENCOUNTER — Other Ambulatory Visit: Payer: Self-pay

## 2024-03-31 ENCOUNTER — Encounter: Payer: Self-pay | Admitting: General Practice

## 2024-03-31 ENCOUNTER — Ambulatory Visit: Admitting: General Practice

## 2024-03-31 VITALS — BP 155/103 | HR 82 | Ht 68.0 in | Wt 292.0 lb

## 2024-03-31 DIAGNOSIS — Z23 Encounter for immunization: Secondary | ICD-10-CM

## 2024-03-31 NOTE — Progress Notes (Signed)
 Kaleia Longhi here for Gardasil 9 Injection #3.  Injection administered without complication. Patient's blood pressure is elevated at today's visit, she reports not taking her medicine for a couple days. Advised taking medicines when she gets home. She has a PCP appt next week. Patient will return for annual exam in December.   Elenor Mole, RN 03/31/2024  10:47 AM
